# Patient Record
Sex: Male | Born: 2000 | Hispanic: Yes | Marital: Single | Smoking: Never smoker
Health system: Southern US, Community
[De-identification: ages and names within clinical notes are randomized; demographics above are authoritative.]

## PROBLEM LIST (undated history)

## (undated) ENCOUNTER — Emergency Department (HOSPITAL_COMMUNITY): Admission: EM | Payer: Medicaid Other | Source: Home / Self Care

## (undated) DIAGNOSIS — G801 Spastic diplegic cerebral palsy: Secondary | ICD-10-CM

## (undated) DIAGNOSIS — K5904 Chronic idiopathic constipation: Secondary | ICD-10-CM

## (undated) DIAGNOSIS — R6510 Systemic inflammatory response syndrome (SIRS) of non-infectious origin without acute organ dysfunction: Secondary | ICD-10-CM

## (undated) DIAGNOSIS — Q112 Microphthalmos: Secondary | ICD-10-CM

## (undated) DIAGNOSIS — Z638 Other specified problems related to primary support group: Secondary | ICD-10-CM

## (undated) DIAGNOSIS — G8250 Quadriplegia, unspecified: Secondary | ICD-10-CM

## (undated) DIAGNOSIS — F79 Unspecified intellectual disabilities: Secondary | ICD-10-CM

## (undated) DIAGNOSIS — Q159 Congenital malformation of eye, unspecified: Secondary | ICD-10-CM

## (undated) DIAGNOSIS — J209 Acute bronchitis, unspecified: Secondary | ICD-10-CM

## (undated) DIAGNOSIS — F19939 Other psychoactive substance use, unspecified with withdrawal, unspecified: Secondary | ICD-10-CM

## (undated) DIAGNOSIS — K219 Gastro-esophageal reflux disease without esophagitis: Secondary | ICD-10-CM

## (undated) DIAGNOSIS — G809 Cerebral palsy, unspecified: Secondary | ICD-10-CM

## (undated) HISTORY — PX: BACK SURGERY: SHX140

## (undated) HISTORY — DX: Acute bronchitis, unspecified: J20.9

## (undated) HISTORY — DX: Systemic inflammatory response syndrome (sirs) of non-infectious origin without acute organ dysfunction: R65.10

## (undated) HISTORY — DX: Spastic diplegic cerebral palsy: G80.1

## (undated) HISTORY — PX: GASTROSTOMY TUBE PLACEMENT: SHX655

## (undated) HISTORY — DX: Other psychoactive substance use, unspecified with withdrawal, unspecified: F19.939

## (undated) HISTORY — DX: Other specified problems related to primary support group: Z63.8

---
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
## 2019-01-04 ENCOUNTER — Telehealth: Payer: Self-pay

## 2019-01-04 NOTE — Telephone Encounter (Signed)
Fever, complaining about stomach hurting.  Not sleeping in two days. Made an appt.

## 2019-01-05 ENCOUNTER — Ambulatory Visit (INDEPENDENT_AMBULATORY_CARE_PROVIDER_SITE_OTHER): Payer: Medicaid Other | Admitting: Pediatrics

## 2019-01-05 ENCOUNTER — Telehealth: Payer: Self-pay | Admitting: Pediatrics

## 2019-01-05 ENCOUNTER — Encounter: Payer: Self-pay | Admitting: Pediatrics

## 2019-01-05 ENCOUNTER — Ambulatory Visit (HOSPITAL_COMMUNITY)
Admission: RE | Admit: 2019-01-05 | Discharge: 2019-01-05 | Disposition: A | Discharge status: Home / Self Care | Payer: Medicaid Other | Source: Ambulatory Visit | Attending: Pediatrics | Admitting: Pediatrics

## 2019-01-05 DIAGNOSIS — B349 Viral infection, unspecified: Secondary | ICD-10-CM | POA: Diagnosis not present

## 2019-01-05 DIAGNOSIS — R509 Fever, unspecified: Secondary | ICD-10-CM

## 2019-01-05 DIAGNOSIS — R109 Unspecified abdominal pain: Secondary | ICD-10-CM

## 2019-01-05 LAB — POCT INFLUENZA A/B
INFLUENZA A, POC: NEGATIVE
Influenza B, POC: NEGATIVE

## 2019-01-05 NOTE — Progress Notes (Signed)
Subjective:     History was provided by the father and step mother.  Jeffery [name] is a 18 y.o. male here for evaluation of fever and abdominal pain. Symptoms began a few days ago, with little improvement since that time. Associated symptoms include none. Father denies nasal congestion, nonproductive cough and no vomiting, diarrhea or changes in stools that father is aware of. The patient was sent home from school about 4 days ago, when his father states the school teacher said he behaves as if his "abdomen hurts." He last had Tylenol this morning for a "fever" and his father states that the patient seems to be less uncomfortable after he takes Tylenol. His father also states that the fever has been going on for the past 4 days as well . He is G tube fed and they state that they have not had any problems with his G tube feedings.  His step mother states he has had at least 4 wet diapers since last night.   The following portions of the patient's history were reviewed and updated as appropriate: allergies, current medications, past family history, past medical history, past social history, past surgical history and problem list.  Review of Systems Constitutional: negative except for fevers Eyes: negative for redness. Ears, nose, mouth, throat, and face: negative for nasal congestion Respiratory: negative for cough. Gastrointestinal: negative for diarrhea and vomiting.   Objective:    Temp 98 F (36.7 C)   Wt 52 lb (23.6 kg)  General:   alert  HEENT:   right and left TM normal without fluid or infection, neck without nodes and throat normal without erythema or exudate  Neck:  no adenopathy.  Lungs:  clear to auscultation bilaterally  Heart:  regular rate and rhythm, S1, S2 normal, no murmur, click, rub or gallop  Abdomen:   normal findings: bowel sounds normal, no masses palpable and soft, non-tender     Assessment:    Fever Abdominal Pain.   Plan:  .1. Abdominal pain,  unspecified abdominal location - POCT Influenza A/B negative - DG Abd 2 Views; Future - Discussed xray result with father, recommended increasing Miralax to 3 to 4 times a day until stools are liquid consistency, then go back to twice a day   2. Fever, unspecified fever cause - POCT Influenza A/B negative   Normal progression of disease discussed. All questions answered. Follow up as needed should symptoms fail to improve.    RTC as scheduled

## 2019-01-05 NOTE — Telephone Encounter (Signed)
Discussed xray result with father, recommended increasing Miralax to 3 to 4 times a day until stools are liquid consistency, then go back to twice a day

## 2019-01-09 ENCOUNTER — Telehealth: Payer: Self-pay

## 2019-01-09 NOTE — Telephone Encounter (Signed)
Kim RN wanted an approval for the visit to give supplies to the home. Kim talked to Dr. [name].Break downs on the skin on the hip wanted mom to make and appt. To be seen.

## 2019-01-10 ENCOUNTER — Telehealth: Payer: Self-pay | Admitting: Licensed Clinical Social Worker

## 2019-01-10 ENCOUNTER — Encounter: Payer: Self-pay | Admitting: Pediatrics

## 2019-01-10 ENCOUNTER — Ambulatory Visit: Payer: Medicaid Other | Admitting: Pediatrics

## 2019-01-10 ENCOUNTER — Ambulatory Visit (INDEPENDENT_AMBULATORY_CARE_PROVIDER_SITE_OTHER): Payer: Medicaid Other | Admitting: Pediatrics

## 2019-01-10 ENCOUNTER — Encounter: Payer: Medicaid Other | Admitting: Licensed Clinical Social Worker

## 2019-01-10 VITALS — Temp 100.0°F

## 2019-01-10 DIAGNOSIS — J40 Bronchitis, not specified as acute or chronic: Secondary | ICD-10-CM | POA: Diagnosis not present

## 2019-01-10 DIAGNOSIS — L089 Local infection of the skin and subcutaneous tissue, unspecified: Secondary | ICD-10-CM | POA: Diagnosis not present

## 2019-01-10 LAB — POC INFLUENZA A&B (BINAX/QUICKVUE)
INFLUENZA B, POC: NEGATIVE
Influenza A, POC: NEGATIVE

## 2019-01-10 MED ORDER — MUPIROCIN 2 % EX OINT: TOPICAL_OINTMENT | CUTANEOUS | 1 refills | Status: DC

## 2019-01-10 MED ORDER — AZITHROMYCIN 200 MG/5ML PO SUSR: ORAL | 0 refills | Status: DC

## 2019-01-10 NOTE — Patient Instructions (Signed)
Bronquitis aguda en nios  Acute Bronchitis, Pediatric    La bronquitis aguda es la hinchazn repentina (aguda) de las vas areas (bronquios) en los pulmones. La bronquitis aguda hace que se llenen estas vas con mucosidad y provoca dificultad para respirar. Tambin puede causar tos o sibilancias.  En los nios, la bronquitis aguda puede durar varias semanas. Una tos causada por bronquitis puede durar incluso ms tiempo. La bronquitis puede causar ms problemas pulmonares, como la enfermedad pulmonar obstructiva crnica (EPOC).  Cules son las causas?  Esta afeccin puede ser causada por grmenes y por sustancias que irritan los pulmones, por ejemplo:   Virus del resfro y de la gripe. La causa ms frecuente de esta afeccin en los nios menores de 1 ao de edad es el virus respiratorio sincicial (VRS).   Bacterias.   Exposicin al humo del tabaco, polvo, gases y contaminacin del aire.  Qu incrementa el riesgo?  Es ms probable que esta afeccin se manifieste en nios que:   Tienen contacto cercano con alguien con bronquitis aguda.   Estn expuestos a sustancias que irritan los pulmones, como el humo del tabaco, polvo, gases y vapores.   Tienen un sistema inmunitario dbil.   Tienen una afeccin respiratoria, como el asma.  Cules son los signos o sntomas?  Los sntomas de esta afeccin incluyen los siguientes:   Tos.   Despedir una mucosidad transparente, amarilla o verde al toser.   Sibilancias.   Opresin o congestin en el pecho.   Falta de aire.   Fiebre.   Dolores en el cuerpo.   Escalofros.   Dolor de garganta.  Cmo se diagnostica?  Esta afeccin se diagnostica mediante un examen fsico. Durante el examen, el pediatra escuchar los pulmones del nio. El mdico tambin podr hacer lo siguiente:   Analizar una muestra de mucosidad del nio para detectar una infeccin bacteriana.   Confirmar el nivel de oxgeno en la sangre del nio. Esto se hace para determinar si hay  neumona.   Realizar una radiografa de trax o pruebas de la funcin pulmonar para descartar una neumona u otras afecciones.   Realizar anlisis de sangre.  El mdico tambin har preguntas sobre los sntomas y antecedentes mdicos del nio.  Cmo se trata?  La mayora de los casos de bronquitis aguda se recupera con el tiempo, sin tratamiento. El pediatra tambin puede recomendar lo siguiente:   Beber ms lquidos. Beber ms cantidad de lquidos ayuda al nio a diluir la mucosidad, lo cual puede facilitar la respiracin.   Tomar un medicamento para la tos.   Tomar un antibitico. Si la afeccin del nio se debe a una bacteria, se le puede recetar un antibitico.   Usar un inhalador para respirar mejor y controlar la tos.   Usar un humidificador o vapor para aflojar la mucosidad y mejorar la respiracin.  Siga estas indicaciones en su casa:  Medicamentos   Administre al nio los medicamentos de venta libre y los recetados solamente como se lo haya indicado el pediatra.   Si le recetaron un antibitico al nio, adminstreselo segn lo indicado por el pediatra. Nointerrumpa el antibitico aunque el nio comience a sentirse mejor.   No le administre miel ni productos para la tos que contengan miel a los nios menores de 1 ao de edad por el riesgo del botulismo. La miel puede ayudar a disminuir la tos en los nios mayores de 1 ao de edad.   No le administre medicamentos antitusivos al nio a   menos que el pediatra se lo indique. En la mayora de los casos, los medicamentos para la tos no se deben administrar a nios menores de 6 aos de edad.  Instrucciones generales     Permita que el nio descanse.   Haga que el nio beba suficiente lquido para mantener la orina de color amarillo plido.   Evite la exposicin del nio al humo de tabaco u otras sustancias perjudiciales, tales como polvo o vapores.   Utilice un inhalador, un humidificador o vapor, segn lo indicado por el mdico. Para usar el vapor  de manera segura:  ? Hierva agua.  ? Pase el agua a un bol.  ? Haga que el nio inhale el vapor del bol.   Concurra a todas las visitas de control como se lo haya indicado el pediatra. Esto es importante.  Cmo se evita?  Para disminuir el riesgo de que el nio vuelva a sufrir esta afeccin:   Asegrese de que el nio se lave las manos con agua y jabn con frecuencia. Haga que el nio use un desinfectante para manos si no se dispone de agua y jabn.   Mantenga al da todas las vacunas del nio.   Asegrese de que el nio reciba la vacuna contra la gripe todos los aos.   Ayude al nio a evitar la exposicin al humo exhalado por otros fumadores y otros irritantes pulmonares.  Comunquese con un mdico si:   La tos o la sibilancia del nio duran 2 semanas o ms.   La tos o la sibilancia del nio empeoran despus de que el nio se recuesta o est activo.  Solicite ayuda de inmediato si:   El nio escupe sangre al toser.   El nio est muy dbil, cansado o le falta el aire.   El nio se desmaya.   El nio vomita.   El nio tiene dolor de cabeza intenso.   El nio tiene fiebre alta que no disminuye.   El nio es menor de 3meses y tiene fiebre de 100F (38C) o ms.  Esta informacin no tiene como fin reemplazar el consejo del mdico. Asegrese de hacerle al mdico cualquier pregunta que tenga.  Document Released: 10/29/2016 Document Revised: 08/26/2017 Document Reviewed: 04/28/2016  Elsevier Interactive Patient Education  2019 Elsevier Inc.

## 2019-01-10 NOTE — Progress Notes (Signed)
Subjective:     History was provided by the mother..Due to language barrier, an interpreter was present during the history-taking and subsequent discussion (and for part of the physical exam) with this patient.  Jeffery [name] is a 18 y.o. male here for evaluation of cough and fever. Symptoms began a few days ago, with no improvement since that time. Associated symptoms include nasal congestion. Patient denies vomiting, diarrhea .  He also has an area of skin break down on his right hip. His mother has used a "cream" and it has helped.   The following portions of the patient's history were reviewed and updated as appropriate: allergies, current medications, past medical history, past social history and problem list.  Review of Systems Constitutional: negative for fevers Eyes: negative for redness. Ears, nose, mouth, throat, and face: negative for nasal congestion Respiratory: negative except for cough. Gastrointestinal: negative for diarrhea and vomiting.   Objective:    Temp 100 F (37.8 C)  General:   alert  HEENT:   right and left TM normal without fluid or infection, neck without nodes, throat normal without erythema or exudate and nasal mucosa congested  Neck:  no adenopathy.  Lungs:  clear to auscultation bilaterally  Heart:  regular rate and rhythm, S1, S2 normal, no murmur, click, rub or gallop  Abdomen:   soft, non-tender; bowel sounds normal; no masses,  no organomegaly  Skin:   small area erythema of right hip      Assessment:    Bronchitis  Skin infection.   Plan:  .1. Bronchitis - POC Influenza A&B(BINAX/QUICKVUE) negative  - azithromycin (ZITHROMAX) 200 MG/5ML suspension; Take 6 ml on day one, then 3 ml once a day for 4 more days. Spanish instructions please.  Dispense: 15 mL; Refill: 0  2. Skin infection - mupirocin ointment (BACTROBAN) 2 %; Apply to rash on buttocks three times a day for 7 days. Spanish instructions please  Dispense: 22 g; Refill:  1 After skin improves, use Vaseline two to three times a day to protect skin    Normal progression of disease discussed. All questions answered. Follow up as needed should symptoms fail to improve.

## 2019-01-26 ENCOUNTER — Encounter: Payer: Self-pay | Admitting: Pediatrics

## 2019-02-03 ENCOUNTER — Telehealth: Payer: Self-pay | Admitting: Pediatrics

## 2019-02-03 NOTE — Telephone Encounter (Signed)
Tc from [facility], works with his M[facility] inquiring on some information about patient, non urgent can be contacted on Monday.

## 2019-02-06 NOTE — Telephone Encounter (Signed)
Called to see what information she was needing from pt. No answer. Left message to give [place] a call.

## 2019-02-07 NOTE — Telephone Encounter (Signed)
Jeffery [name] is wanting to know if pt can qualify for a program within medicaid either personal or CAT program to help mom with booster seat/ car seat. Also wants to know if step mom can be the CAP aide since they have tried having someone at home to give care to pt and pt did not like that and would not help as they thought the aide would. If so she is needing the provider to make that referral.

## 2019-02-07 NOTE — Telephone Encounter (Signed)
Please call and find out from [place] if they can fax forms to [place]. I have had patient like Jeffery [name] receive those things, but, it has always been after the agency, etc sends me the order form for what is needed.   Thank you

## 2019-02-07 NOTE — Telephone Encounter (Signed)
Jeffery [name] states she will speak to step mom to see which program she wants to do and then will contact us back

## 2019-02-08 ENCOUNTER — Telehealth: Payer: Self-pay

## 2019-02-08 NOTE — Telephone Encounter (Signed)
Already done the referral today

## 2019-02-08 NOTE — Telephone Encounter (Signed)
Called about the referral to be sent to [facility] program. I told her I will get the referral done today and I will call her after it is done today.

## 2019-02-15 NOTE — Telephone Encounter (Signed)
Jeffery [name] has taken care of this.  

## 2019-02-17 ENCOUNTER — Encounter: Payer: Self-pay | Admitting: Pediatrics

## 2019-02-20 ENCOUNTER — Encounter: Payer: Self-pay | Admitting: Pediatrics

## 2019-02-20 ENCOUNTER — Emergency Department (HOSPITAL_COMMUNITY)
Admission: EM | Admit: 2019-02-20 | Discharge: 2019-02-20 | Disposition: A | Discharge status: Home / Self Care | Payer: Medicaid Other | Attending: Emergency Medicine | Admitting: Emergency Medicine

## 2019-02-20 ENCOUNTER — Ambulatory Visit (INDEPENDENT_AMBULATORY_CARE_PROVIDER_SITE_OTHER): Payer: Medicaid Other | Admitting: Pediatrics

## 2019-02-20 ENCOUNTER — Other Ambulatory Visit: Payer: Self-pay

## 2019-02-20 ENCOUNTER — Encounter (HOSPITAL_COMMUNITY): Payer: Self-pay | Admitting: Emergency Medicine

## 2019-02-20 VITALS — Wt <= 1120 oz

## 2019-02-20 DIAGNOSIS — G800 Spastic quadriplegic cerebral palsy: Secondary | ICD-10-CM | POA: Diagnosis not present

## 2019-02-20 DIAGNOSIS — Z79899 Other long term (current) drug therapy: Secondary | ICD-10-CM | POA: Insufficient documentation

## 2019-02-20 DIAGNOSIS — Z931 Gastrostomy status: Secondary | ICD-10-CM | POA: Insufficient documentation

## 2019-02-20 DIAGNOSIS — L89104 Pressure ulcer of unspecified part of back, stage 4: Secondary | ICD-10-CM | POA: Diagnosis not present

## 2019-02-20 DIAGNOSIS — F79 Unspecified intellectual disabilities: Secondary | ICD-10-CM | POA: Insufficient documentation

## 2019-02-20 DIAGNOSIS — Z4800 Encounter for change or removal of nonsurgical wound dressing: Secondary | ICD-10-CM | POA: Diagnosis present

## 2019-02-20 LAB — CBC WITH DIFFERENTIAL/PLATELET
Abs Immature Granulocytes: 0.01 10*3/uL (ref 0.00–0.07)
Basophils Absolute: 0.1 10*3/uL (ref 0.0–0.1)
Basophils Relative: 1 %
Eosinophils Absolute: 0.1 10*3/uL (ref 0.0–0.5)
Eosinophils Relative: 2 %
HCT: 41.4 % (ref 39.0–52.0)
Hemoglobin: 13.1 g/dL (ref 13.0–17.0)
Immature Granulocytes: 0 %
Lymphocytes Relative: 48 %
Lymphs Abs: 2.9 10*3/uL (ref 0.7–4.0)
MCH: 30.5 pg (ref 26.0–34.0)
MCHC: 31.6 g/dL (ref 30.0–36.0)
MCV: 96.5 fL (ref 80.0–100.0)
Monocytes Absolute: 0.3 10*3/uL (ref 0.1–1.0)
Monocytes Relative: 6 %
Neutro Abs: 2.6 10*3/uL (ref 1.7–7.7)
Neutrophils Relative %: 43 %
PLATELETS: 331 10*3/uL (ref 150–400)
RBC: 4.29 MIL/uL (ref 4.22–5.81)
RDW: 14.8 % (ref 11.5–15.5)
WBC: 6 10*3/uL (ref 4.0–10.5)
nRBC: 0 % (ref 0.0–0.2)

## 2019-02-20 LAB — BASIC METABOLIC PANEL
Anion gap: 7 (ref 5–15)
BUN: 7 mg/dL (ref 6–20)
CO2: 28 mmol/L (ref 22–32)
Calcium: 9.5 mg/dL (ref 8.9–10.3)
Chloride: 106 mmol/L (ref 98–111)
Creatinine, Ser: 0.36 mg/dL — ABNORMAL LOW (ref 0.61–1.24)
GFR calc Af Amer: >60 mL/min (ref >60)
GFR calc non Af Amer: >60 mL/min (ref >60)
Glucose, Bld: 87 mg/dL (ref 70–99)
Potassium: 3.9 mmol/L (ref 3.5–5.1)
Sodium: 141 mmol/L (ref 135–145)

## 2019-02-20 MED ORDER — COLLAGENASE 250 UNIT/GM EX OINT
TOPICAL_OINTMENT | Freq: Every day | CUTANEOUS | Status: DC
  Administered 2019-02-20: 1 via TOPICAL
  Filled 2019-02-20: qty 30

## 2019-02-20 NOTE — ED Triage Notes (Signed)
Pt mother reports they were referred by pediatrician for sore on his left butt cheek, reports it is in the process of healing.

## 2019-02-20 NOTE — Discharge Instructions (Addendum)
You should have received a phone call confirming your appointment with the wound care clinic tomorrow.  This is very important! Please make sure that you go to this appointment.   Let them know that we sent you home with the Santyl ointment.   Apply ointment daily.   Return to ER for fevers, new or worsening symptoms, any additional concerns.

## 2019-02-20 NOTE — Consult Note (Signed)
WOC Nurse wound consult note Patient in [facility] ED014.  Mother at bedside. 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 Cover with a saline moistened gauze, then dry gauze or ABD pad.  Change daily. Additional recommendations: keep the wound care center appointment scheduled for 3/31.   I have discussed with Elizabeth Sauer, PA, the need for a chair cushion and have recommended a Roho cushion that will hopefully to obtained by care at the wound care center.   Also, the mother has been instructed via interpreter services, to turn her son and keep him off of the area while sitting and while lying down.  She was able to correctly state the steps of applying the Santyl and dressings, again, with the use of interpreter services.  Thank you for the consult.  Discussed plan of care with the patient and PA.  WOC nurse will not follow at this time.  Please re-consult the WOC team if needed.  Helmut Muster, RN, MSN, CWOCN, CNS-BC, pager [phone]

## 2019-02-20 NOTE — ED Notes (Signed)
Discharge instructions discussed with guardian. Guardian verbalized understanding. Pt stable and leaving with guardian.

## 2019-02-20 NOTE — ED Notes (Signed)
Santyl applied to wound, covered it with moist gauze and an abd pad.  Instructed mother on wound care.
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
## 2019-02-20 NOTE — Progress Notes (Signed)
  Subjective:     Patient ID: Jeffery [name], male   DOB: [birth year], 18 y.o.   MRN: [id number]  HPI  .Due to language barrier, an interpreter was present during the history-taking and subsequent discussion (and for part of the physical exam) with this patient.  The patient is here today with his step mother for concern about a wound on his lower back. She feels that is has improved since he was last seen here in February. No fevers. She has been using different OTC "first aid" creams on the area. He does have cerebral palsy, no home health care.   Review of Systems .Review of Symptoms: General ROS: negative for - fever ENT ROS: negative for - nasal congestion Respiratory ROS: no cough, shortness of breath, or wheezing Gastrointestinal ROS: negative for - diarrhea or nausea/vomiting     Objective:   Physical Exam Wt 52 lb 4 oz (23.7 kg)   General Appearance:  Alert, cooperative, no distress, appropriate for age                            Head:  Normocephalic, no obvious abnormality            Skin/Hair/Nails:  Skin warm, dry, approx 4 cm by 4 cm ulcer 4                   Neurologic:  Alert and oriented, wheel chair      Assessment:     Decubitus Ulcer     Plan:     .1. Pressure injury of back, stage 4 (HCC) Spoke with provider at [facility] ED - MD was told that the patient could be "ruled out for sepsis" and that "there was not anyone there who could manage his wound, and to call [facility] outpatient wound care clinic" MD had patient's step mother take him to the [facility] ED in [place] now, step mother agreed to this plan

## 2019-02-21 ENCOUNTER — Encounter (HOSPITAL_BASED_OUTPATIENT_CLINIC_OR_DEPARTMENT_OTHER): Payer: Self-pay

## 2019-02-21 ENCOUNTER — Encounter (HOSPITAL_BASED_OUTPATIENT_CLINIC_OR_DEPARTMENT_OTHER): Payer: Medicaid Other | Attending: Internal Medicine

## 2019-02-21 DIAGNOSIS — L89324 Pressure ulcer of left buttock, stage 4: Secondary | ICD-10-CM | POA: Insufficient documentation

## 2019-02-21 DIAGNOSIS — G800 Spastic quadriplegic cerebral palsy: Secondary | ICD-10-CM | POA: Diagnosis not present

## 2019-02-21 DIAGNOSIS — Z931 Gastrostomy status: Secondary | ICD-10-CM | POA: Insufficient documentation

## 2019-02-21 DIAGNOSIS — F73 Profound intellectual disabilities: Secondary | ICD-10-CM | POA: Insufficient documentation

## 2019-03-02 ENCOUNTER — Telehealth: Payer: Self-pay | Admitting: Pediatrics

## 2019-03-02 NOTE — Telephone Encounter (Signed)
MD received a letter from Evie Lacks with [employer] [place], Nurse Care Manager regarding patient (see scanned letter). MD called [facility] and spoke with a representative who stated that they changed name of their company, and the steps to send a referral. Since that was not helpful, MD called and spoke with RN Eunice Blase and she stated that that a nurse, Christiane Ha with the [facility] in [place] had talked with her, and he said he/his clinic would take care of orders for home health nurse for wound care and order a condom catheter (to keep urine from contaminating wound).

## 2019-03-07 ENCOUNTER — Encounter (HOSPITAL_BASED_OUTPATIENT_CLINIC_OR_DEPARTMENT_OTHER): Payer: Medicaid Other | Attending: Internal Medicine

## 2019-03-07 DIAGNOSIS — G800 Spastic quadriplegic cerebral palsy: Secondary | ICD-10-CM | POA: Insufficient documentation

## 2019-03-07 DIAGNOSIS — F73 Profound intellectual disabilities: Secondary | ICD-10-CM | POA: Diagnosis not present

## 2019-03-07 DIAGNOSIS — Z931 Gastrostomy status: Secondary | ICD-10-CM | POA: Diagnosis not present

## 2019-03-07 DIAGNOSIS — L89324 Pressure ulcer of left buttock, stage 4: Secondary | ICD-10-CM | POA: Insufficient documentation

## 2019-03-07 DIAGNOSIS — L97522 Non-pressure chronic ulcer of other part of left foot with fat layer exposed: Secondary | ICD-10-CM | POA: Diagnosis not present

## 2019-03-21 DIAGNOSIS — L89324 Pressure ulcer of left buttock, stage 4: Secondary | ICD-10-CM | POA: Diagnosis not present

## 2019-03-27 ENCOUNTER — Encounter: Payer: Self-pay | Admitting: Licensed Clinical Social Worker

## 2019-04-04 ENCOUNTER — Encounter (HOSPITAL_BASED_OUTPATIENT_CLINIC_OR_DEPARTMENT_OTHER): Payer: Medicaid Other | Attending: Internal Medicine

## 2019-04-04 DIAGNOSIS — G800 Spastic quadriplegic cerebral palsy: Secondary | ICD-10-CM | POA: Insufficient documentation

## 2019-04-04 DIAGNOSIS — L89324 Pressure ulcer of left buttock, stage 4: Secondary | ICD-10-CM | POA: Insufficient documentation

## 2019-04-04 DIAGNOSIS — F73 Profound intellectual disabilities: Secondary | ICD-10-CM | POA: Insufficient documentation

## 2019-04-04 DIAGNOSIS — L97522 Non-pressure chronic ulcer of other part of left foot with fat layer exposed: Secondary | ICD-10-CM | POA: Insufficient documentation

## 2019-04-07 DIAGNOSIS — L89324 Pressure ulcer of left buttock, stage 4: Secondary | ICD-10-CM | POA: Diagnosis present

## 2019-04-07 DIAGNOSIS — L97522 Non-pressure chronic ulcer of other part of left foot with fat layer exposed: Secondary | ICD-10-CM | POA: Diagnosis not present

## 2019-04-07 DIAGNOSIS — G800 Spastic quadriplegic cerebral palsy: Secondary | ICD-10-CM | POA: Diagnosis not present

## 2019-04-07 DIAGNOSIS — F73 Profound intellectual disabilities: Secondary | ICD-10-CM | POA: Diagnosis not present

## 2019-04-21 DIAGNOSIS — L89324 Pressure ulcer of left buttock, stage 4: Secondary | ICD-10-CM | POA: Diagnosis not present

## 2019-04-24 NOTE — Telephone Encounter (Signed)
Called to follow up on forms needed.

## 2019-04-28 ENCOUNTER — Encounter (HOSPITAL_BASED_OUTPATIENT_CLINIC_OR_DEPARTMENT_OTHER): Payer: Medicaid Other | Attending: Internal Medicine

## 2019-04-28 DIAGNOSIS — Z09 Encounter for follow-up examination after completed treatment for conditions other than malignant neoplasm: Secondary | ICD-10-CM | POA: Insufficient documentation

## 2019-04-28 DIAGNOSIS — G800 Spastic quadriplegic cerebral palsy: Secondary | ICD-10-CM | POA: Insufficient documentation

## 2019-04-28 DIAGNOSIS — Z872 Personal history of diseases of the skin and subcutaneous tissue: Secondary | ICD-10-CM | POA: Insufficient documentation

## 2019-04-28 DIAGNOSIS — F73 Profound intellectual disabilities: Secondary | ICD-10-CM | POA: Insufficient documentation

## 2019-05-05 ENCOUNTER — Telehealth: Payer: Self-pay | Admitting: Pediatrics

## 2019-05-05 NOTE — Telephone Encounter (Signed)
No he does not have a feeding tube. Thank you for calling her back to let her know

## 2019-05-05 NOTE — Telephone Encounter (Signed)
Baker Janus from [employer] in calling in regards to pt and trying to find out if he gets Nutritions from feeding tube, if so what percentage [phone]

## 2019-05-05 NOTE — Telephone Encounter (Signed)
Called Gail to let her know per Dr. [name] pt doesn't have a feeding tube

## 2019-05-08 DIAGNOSIS — Z09 Encounter for follow-up examination after completed treatment for conditions other than malignant neoplasm: Secondary | ICD-10-CM | POA: Diagnosis present

## 2019-05-08 DIAGNOSIS — Z872 Personal history of diseases of the skin and subcutaneous tissue: Secondary | ICD-10-CM | POA: Diagnosis not present

## 2019-05-08 DIAGNOSIS — F73 Profound intellectual disabilities: Secondary | ICD-10-CM | POA: Diagnosis not present

## 2019-05-08 DIAGNOSIS — G800 Spastic quadriplegic cerebral palsy: Secondary | ICD-10-CM | POA: Diagnosis not present

## 2019-05-10 ENCOUNTER — Telehealth: Payer: Self-pay | Admitting: Pediatrics

## 2019-05-10 NOTE — Telephone Encounter (Signed)
MD spoke with RN Charna Busman and gave a verbal order for diaper program and to continue routine assessments of the patient once every 2 months.

## 2019-06-27 ENCOUNTER — Other Ambulatory Visit: Payer: Self-pay

## 2019-06-27 ENCOUNTER — Ambulatory Visit (INDEPENDENT_AMBULATORY_CARE_PROVIDER_SITE_OTHER): Payer: Medicaid Other | Admitting: Pediatrics

## 2019-06-27 ENCOUNTER — Ambulatory Visit (INDEPENDENT_AMBULATORY_CARE_PROVIDER_SITE_OTHER): Payer: Self-pay | Admitting: Licensed Clinical Social Worker

## 2019-06-27 ENCOUNTER — Encounter: Payer: Self-pay | Admitting: Pediatrics

## 2019-06-27 VITALS — BP 110/74 | Ht <= 58 in | Wt <= 1120 oz

## 2019-06-27 DIAGNOSIS — J4520 Mild intermittent asthma, uncomplicated: Secondary | ICD-10-CM

## 2019-06-27 DIAGNOSIS — Z0001 Encounter for general adult medical examination with abnormal findings: Secondary | ICD-10-CM

## 2019-06-27 DIAGNOSIS — H579 Unspecified disorder of eye and adnexa: Secondary | ICD-10-CM | POA: Diagnosis not present

## 2019-06-27 DIAGNOSIS — J301 Allergic rhinitis due to pollen: Secondary | ICD-10-CM | POA: Diagnosis not present

## 2019-06-27 DIAGNOSIS — K5904 Chronic idiopathic constipation: Secondary | ICD-10-CM | POA: Diagnosis not present

## 2019-06-27 DIAGNOSIS — G801 Spastic diplegic cerebral palsy: Secondary | ICD-10-CM

## 2019-06-27 MED ORDER — CETIRIZINE HCL 5 MG/5ML PO SOLN: ORAL | 5 refills | Status: DC

## 2019-06-27 MED ORDER — POLYETHYLENE GLYCOL 3350 17 GM/SCOOP PO POWD: ORAL | 2 refills | Status: DC

## 2019-06-27 MED ORDER — ALBUTEROL SULFATE HFA 108 (90 BASE) MCG/ACT IN AERS: INHALATION_SPRAY | RESPIRATORY_TRACT | 2 refills | Status: DC

## 2019-06-27 NOTE — Patient Instructions (Signed)
Asma en los adultos Asthma, Adult  El asma es una enfermedad prolongada (crnica) que causa episodios recurrentes de estrechamiento de las vas respiratorias. Las vas respiratorias son los conductos que van desde la Lawyer y la boca hasta los pulmones. Los episodios de asma, tambin denominados ataques de asma, pueden provocar tos, sibilancias, falta de aire y Tourist information centre manager. Las vas respiratorias se pueden llenar de mucosidad. Durante el ataque, puede ser difcil respirar. Los ataques de asma pueden ser leves o potencialmente mortales. El asma no puede curarse, pero los [employer] y los cambios en el estilo de vida lo ayudarn a Aeronautical engineer enfermedad y a Editor, commissioning ataques agudos. Cules son las causas? Se cree que la causa de esta afeccin son factores hereditarios (genticos) y la exposicin a factores ambientales; sin embargo, su causa exacta se desconoce. Hay muchas cosas que pueden provocar un ataque de asma o intensificar los sntomas de la enfermedad (factores desencadenantes). Los desencadenantes del asma son diferentes para cada persona. Los factores desencadenantes comunes incluyen los siguientes:  Moho.  Polvo.  Humo del cigarrillo.  Cucarachas.  Cosas que pueden causar sntomas de Buyer, retail (alrgenos), como el polen del pasto o los rboles y la caspa de los [place].  Sustancias contaminantes del aire, como limpiadores domsticos, humo de lea, niebla txica u olores qumicos.  El [place] fro, los cambios de Scientist, forensic y el viento (que aumenta la cantidad de moho y polen en el aire).  Emociones intensas, como llorar o rer [employer].  Estrs.  Ciertos medicamentos (como la aspirina) o algunos frmacos (como los betabloqueantes).  Los sulfitos presentes en los alimentos y las bebidas. Los alimentos y las bebidas que pueden contener sulfitos son las frutas desecadas, las papas fritas y los vinos espumantes.  Enfermedades infecciosas o inflamatorias, como la gripe,  el resfro o la inflamacin de las membranas nasales (rinitis).  Enfermedad de reflujo gastroesofgico (ERGE).  Ejercicios o actividades extenuantes. Cules son los signos o los sntomas? Los sntomas de esta afeccin pueden aparecer inmediatamente despus de que se desencadene el asma, o varias horas ms tarde. Algunos de los sntomas son los siguientes:  Sibilancias. Sonido parecido a un chiflido al Ambulance person.  Tos excesiva durante la noche o temprano por la maana.  Tos frecuente o intensa durante un resfro comn.  Opresin en el pecho.  Falta de aire.  Cansancio (fatiga) al realizar actividades que requieren un mnimo esfuerzo. Cmo se diagnostica? Esta afeccin se diagnostica en funcin de lo siguiente:  Sus antecedentes mdicos.  [employer], entre ellos, los siguientes: ? Estudios de funcin pulmonar y estudios pulmonares (espirometra). Estos estudios pueden evaluar el flujo de aire en sus pulmones. ? Pruebas de alergia. ? Estudios de diagnstico por imgenes, como radiografas. Cmo se trata? No hay cura para esta afeccin, pero el tratamiento puede ayudar a [employer] sntomas. Por lo general, el tratamiento del asma incluye lo siguiente:  Identificar y Product/process development scientist los factores desencadenantes del asma.  Tomar medicamentos para controlar los sntomas. Generalmente, se usan dos tipos de medicamentos para tratar el asma: ? Medicamentos de control. Estos ayudan a Mining engineer aparicin de los sntomas de asma. Generalmente, se toman [employer]. ? Medicamentos de [place] o de rescate de accin rpida. Estos alivian rpidamente los sntomas del asma al expandir las vas respiratorias estrechas. Se utilizan cuando es necesario y proporcionan alivio a Control and instrumentation engineer.  Usar oxgeno complementario. Puede que esto sea necesario durante un episodio grave.  Usar otros medicamentos, como  los siguientes: ? Medicamentos para las [place], tales como antihistamnicos,  en caso de que sus ataques de asma sean causados por alrgenos. ? Medicamentos inmunolgicos (inmunomoduladores). Estos medicamentos controlan el sistema inmunitario.  Crear un plan de accin para el asma. Esto es una planificacin por escrito para el control y el tratamiento de los ataques de asma. Este plan incluye lo siguiente: ? [place] lista de los factores desencadenantes del asma y el modo de evitarlos. ? Informacin sobre Cabin crew en que se deben tomar los medicamentos y cundo hay que [employer] dosis. ? Indicaciones sobre el uso de un dispositivo llamado espirmetro. El espirmetro es un dispositivo que mide el funcionamiento de los pulmones y la gravedad de su asma. Lo ayuda a controlar la enfermedad. Siga estas instrucciones en su casa: Film/video editor de su casa Controle el ambiente de su casa de la siguiente manera para Product/process development scientist los desencadenantes y prevenir los ataques de asma:  Cambie regularmente el filtro de la calefaccin y del aire acondicionado.  Limite el uso de hogares o estufas a lea.  Elimine las plagas (como cucarachas, ratones) y sus excrementos.  Deseche las plantas si observa moho en ellas.  Limpie regularmente los pisos y el polvo. Utilice productos sin perfume.  Intente que otra persona pase la aspiradora con regularidad. Permanezca fuera de las habitaciones mientras las estn aspirando y durante algn tiempo despus. Si usted pasa la [place], use una mscara para polvo de las que se consiguen en la [place], una bolsa de aspiradora de doble capa o microfiltro o una aspiradora con un filtro de aire de alta eficiencia (HEPA).  Reemplace las alfombras por pisos de [place], baldosas o vinilo. Las alfombras pueden retener la caspa de los animales y [place].  Use almohadas, mantas y Government social research officer.  Mantenga su habitacin libre de desencadenantes.  Evite las [employer] y [employer] ventanas cerradas cuando el aire tenga alrgenos.  Lave  la ropa de cama todas las semanas con agua caliente y squela con aire caliente.  Use mantas de polister o algodn.  Limpie baos y cocinas con lavandina. Si fuera posible, pdale a alguien que vuelva a pintar las paredes de estas habitaciones con Ardelia Mems pintura resistente a los hongos. Mantngase alejado de las habitaciones que se estn limpiando y pintando.  Lvese las manos frecuentemente con agua y [place]. Use desinfectante para manos si no dispone de [place] y [place].  No permita que nadie fume en su casa. Instrucciones generales  Use los medicamentos de venta libre y los recetados solamente como se lo haya indicado el mdico. ? Hable con el mdico si tiene preguntas acerca de cmo o cundo tomar los medicamentos. ? Tome nota en caso de que requiera dosis ms frecuentes.  No consuma ningn producto que contenga nicotina o tabaco, como cigarrillos y Psychologist, sport and exercise. Si necesita ayuda para dejar de fumar, consulte al mdico. Tambin evite la exposicin al humo de otros fumadores.  Use un espirmetro como se lo haya indicado su mdico. Anote y lleve un registro de los [place].  Conozca y [employer] de accin para el asma a fin de minimizar o Scientist, water quality un ataque de asma sin necesidad de buscar atencin mdica.  Asegrese de estar al da con las vacunas anuales como se lo haya indicado el mdico. Esto puede incluir vacunas contra la gripe y la neumona.  Evite realizar actividades al aire libre cuando la cantidad de alrgenos es alta y la calidad del aire es baja.  Al realizar actividades de invierno al aire [place], utilice un pasamontaas que le cubra la nariz y la boca. [address] fros, ejerctese en interiores, de ser posible.  Entre en calor antes de ejercitarse y tmese su tiempo para un perodo de enfriamiento luego de la [place].  Concurra a todas las visitas de seguimiento como se lo haya indicado el mdico. Esto es importante. Dnde buscar ms informacin  Para  obtener ms informacin sobre el asma, acuda a los Centers for [employer] and Prevention (Centros para el control y la prevencin de enfermedades) en http://www.clark.net/.htm  Para informacin sobre la calidad del aire, acuda a [employer] en WeightRating.nl Comunquese con un mdico si:  Tiene sibilancias, le falta el aire o tiene tos aun cuando toma el medicamento para prevenir los ataques.  La mucosidad que expectora cuando tose (esputo) es ms espesa que lo habitual.  Su esputo cambia de un color claro o blanco a un color amarillo, verde, gris o sanguinolento.  Los [employer] causan efectos secundarios, como erupcin cutnea, picazn, hinchazn o dificultad para respirar.  Necesita tomar un medicamento de alivio ms de 2 a 3veces por semana.  Su flujo mximo se mantiene entre el 50% y el 79% de su mejor valor personal despus de seguir el plan de accin durante 1hora.  Tiene fiebre. Solicite ayuda inmediatamente si:  Est empeorando y no responde al tratamiento durante un ataque de asma.  Le falta el aire cuando descansa o cuando hace muy poca actividad fsica.  Tiene dificultad para comer, beber o hablar.  Siente dolor u opresin en el pecho.  Tiene latidos cardacos rpidos o palpitaciones.  Tiene los labios o las uas de un tono [place].  Siente que est por desvanecerse, est mareado o se desmaya.  Su flujo mximo es menor que el 50% del Pharmacist, hospital personal.  Se siente demasiado cansado para respirar con normalidad. Resumen  El asma es una enfermedad prolongada (crnica) que causa episodios recurrentes de estrechamiento de las vas respiratorias. Estos episodios pueden provocar tos, sibilancias, falta de aire y Tourist information centre manager.  El asma no puede curarse, pero los [employer]ne[employer] y los cambios en el estilo de vida lo ayudarn a Aeronautical engineer enfermedad y a Editor, commissioning ataques agudos.  Asegrese de comprender cmo evitar los desencadenantes y cmo y  [place].  Los ataques de asma pueden ser leves o potencialmente mortales. Consiga ayuda de inmediato si tiene un ataque de asma y no responde al tratamiento con los medicamentos de rescate habituales. Esta informacin no tiene Marine scientist el consejo del mdico. Asegrese de hacerle al mdico cualquier pregunta que tenga. Document Released: 11/09/2005 Document Revised: 02/20/2019 Document Reviewed: 02/20/2019 Elsevier Patient Education  [place].

## 2019-06-27 NOTE — Progress Notes (Signed)
Adolescent Well Care Visit Jeffery [name] is a 18 y.o. male who is here for well care.    PCP:  Fransisca Connors, MD   History was provided by the father and stepmother. .Due to language barrier, an interpreter was present during the history-taking and subsequent discussion (and for part of the physical exam) with this patient.  Current Issues: Current concerns include asthma - doing well, not having weekly symptoms.   Would like refills on allergy medicine and medicine for constipation. Not having any problems now with allergies or constipation.    Sleep:  Sleep: normal   Social Screening: Lives with:  Father, step mother  Parental relations:  good   Screenings: Patient has a dental home: yes  Physical Exam:  Vitals:   06/27/19 1422  BP: 110/74  Weight: 52 lb 9.6 oz (23.9 kg)  Height: 4\' 7"  (1.397 m)   BP 110/74   Ht 4\' 7"  (1.397 m)   Wt 52 lb 9.6 oz (23.9 kg)   BMI 12.23 kg/m  Body mass index: body mass index is 12.23 kg/m. Blood pressure percentiles are not available for patients who are 18 years or older.  No exam data present  General Appearance:   alert  HENT: Normocephalic, no obvious abnormality, conjunctiva clear; circular lesion of left eye   Mouth:   Yellow appearing teeth, patient would not completely open mouth   Neck:   Supple; thyroid: no enlargement, symmetric, no tenderness/mass/nodules  Chest Normal   Lungs:   Clear to auscultation bilaterally, normal work of breathing  Heart:   Regular rate and rhythm, S1 and S2 normal, no murmurs;   Abdomen:   Soft, non-tender, no mass, or organomegaly  Musculoskeletal:   Increase upper and lower extremity tone in a wheel chair               Skin/Hair/Nails:   Skin warm, dry and intact, no rashes, no bruises or petechiae     Assessment and Plan:   .1. 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 Eye lesion - Ambulatory referral to Ophthalmology  4. Functional constipation - polyethylene glycol powder (GLYCOLAX/MIRALAX) 17 GM/SCOOP powder; Take 17 grams in 8 ounces of juice or water once to twice a day for up to one week as needed for constipation. Spanish instructions  Dispense: 510 g; Refill: 2  5. Seasonal allergic rhinitis due to pollen - cetirizine HCl (CETIRIZINE HCL CHILDRENS ALRGY) 5 MG/5ML SOLN; 10 mg by per G Tube route nightly as needed for allergies. Spanish instructions  Dispense: 300 mL; Refill: 5  6. Mild intermittent asthma without complication - albuterol (PROAIR HFA) 108 (90 Base) MCG/ACT inhaler; INHALE TWO PUFFS EVERY FOUR HOURS AS NEEDED FOR WHEEZING or COUGHING. Spanish instructions.  Dispense: 8.5 g; Refill: 2   BMI is appropriate for age  Hearing screening result:not examined Vision screening result: not examined  Counseling provided for all of the vaccine components  Orders Placed This Encounter  Procedures  . Ambulatory referral to Neurology  . Ambulatory referral to Ophthalmology   Family to take patient to local health dept for Men B #2 because of age    Return if symptoms worsen or fail to improve.Fransisca Connors, MD

## 2019-06-27 NOTE — BH Specialist Note (Signed)
Integrated Behavioral Health Follow Up Visit  MRN: [id number] Name: Jeffery [name]  Number of [place] Clinician visits: 1/6 Session Start time: 2:20pm  Session End time: 2:30pm Total time: 10 mins  Type of Service: Integrated Behavioral Health- Family Interpretor:No.  SUBJECTIVE: Jeffery [name] is a 18 y.o. male accompanied by Father and Stepmom Patient was referred by Dr. [name] to discuss transition to an adult PCP. Patient reports the following symptoms/concerns: No concerns reported by family today. Duration of problem: n/a; Severity of problem: n/a  OBJECTIVE: Mood: NA and Affect: Appropriate Risk of harm to self or others: No plan to harm self or others  LIFE CONTEXT: Family and Social: Patient lives with Dad, Step-Mom and Step-Sibling. School/Work: n/a, Father is caregiver Self-Care: Patient now has CAP services and an in-home nurse to help manage care.  Life Changes: Father is now legal guardian (Patient is deemed incompetent)  GOALS ADDRESSED: Patient will: 1.  Reduce symptoms of: stress  2.  Increase knowledge and/or ability of: stress reduction  3.  Demonstrate ability to: Increase adequate support systems for patient/family  INTERVENTIONS: Interventions utilized:  Link to [employer] Standardized Assessments completed: Not Needed  ASSESSMENT: Patient currently experiencing no concerns,  Dad was provided a list of PCP's for adults in the area based on Patient's age and health needs.   Patient may benefit from follow up as needed until he can be connected with general PCP for adults.  PLAN: 1. Follow up with behavioral health clinician as needed 2. Behavioral recommendations: return as needed 3. Referral(s): [place] (In Clinic)   Georgianne Fick, [facility]

## 2019-08-22 ENCOUNTER — Telehealth: Payer: Self-pay | Admitting: Pediatrics

## 2019-08-22 DIAGNOSIS — G800 Spastic quadriplegic cerebral palsy: Secondary | ICD-10-CM

## 2019-08-22 NOTE — Telephone Encounter (Signed)
Please call parent and let her know that patient does have more refills of albuterol at their pharmacy. His parent needs to call the pharmacy for more refills.  Also, could you call [place] to find out if and how I can order a "shower chair and stroller for transportation" for this patient. I received a document with those requested for the patient from [place] for the patient.

## 2019-08-23 MED ORDER — UNABLE TO FIND: 0 refills | Status: AC

## 2019-08-23 NOTE — Telephone Encounter (Addendum)
Rx printed and ready for faxing to [place] for shower chair.   No rx written for stroller, since [place] only has wheelchairs.   Could you call patient's [place] to see where I could send on order for stroller for this patien, her name is Nurse Caryl Pina, [phone]  Thank you!

## 2019-08-23 NOTE — Telephone Encounter (Signed)
Called to let know of refills not able to leave message due to vm not being set up.

## 2019-08-23 NOTE — Addendum Note (Signed)
Addended by: Fransisca Connors on: 08/23/2019 10:45 AM   Modules accepted: Orders

## 2019-08-23 NOTE — Telephone Encounter (Signed)
Called and spoke to Jeffery [name] states they don't have a Chair for transportation only thing they have is a wheelchair. They do have a shower chair all they need is a Rx diagnosis and a ht and wt. Can be faxed to [phone]

## 2019-08-23 NOTE — Telephone Encounter (Signed)
See below.  Thank you

## 2019-08-23 NOTE — Telephone Encounter (Signed)
Called Caryl Pina no answer left message to give [place] a call. Let dad know that he can get refills for albuterol and rx was sent for a shower chair. Parents appreciative call.

## 2019-08-24 ENCOUNTER — Telehealth: Payer: Self-pay | Admitting: Licensed Clinical Social Worker

## 2019-08-24 DIAGNOSIS — G801 Spastic diplegic cerebral palsy: Secondary | ICD-10-CM

## 2019-08-24 NOTE — Telephone Encounter (Signed)
A user error has taken place: Ashley's number is [phone]

## 2019-08-24 NOTE — Telephone Encounter (Signed)
Corrected fax number [phone], she will fax over a new order as well.  

## 2019-08-24 NOTE — Telephone Encounter (Signed)
Jeffery [name] called back from [facility] to discuss barriers getting stroller requested by Step-Mom. I confirmed that the Patient does have CAP services, she asked where we are with referral to PT.  Asked that we follow up with referral to PT because they can order stroller if deemed necessary.

## 2019-08-30 NOTE — Telephone Encounter (Signed)
PT ordered, thank you!

## 2019-08-30 NOTE — Telephone Encounter (Signed)
Please take a look at this, can you do referral for PT and then they can order stroller

## 2019-08-30 NOTE — Addendum Note (Signed)
Addended by: Fransisca Connors on: 08/30/2019 11:50 AM   Modules accepted: Orders

## 2019-09-06 NOTE — Telephone Encounter (Signed)
A user error has taken place: no phone note needed

## 2019-09-12 ENCOUNTER — Telehealth: Payer: Self-pay

## 2019-09-12 NOTE — Telephone Encounter (Signed)
Caryl Pina called about meds. Dose on his gabatentin. [phone].

## 2019-09-12 NOTE — Telephone Encounter (Signed)
Home health nurse or other nurse needs to contact Neurology about Gabapentin because none of the providers here prescribe that medication.   Thank you

## 2019-09-12 NOTE — Telephone Encounter (Signed)
Called Jeffery [name] back to let her know and got the voicemail and I left her a message what to do.

## 2019-11-02 ENCOUNTER — Telehealth: Payer: Self-pay | Admitting: Pediatrics

## 2019-11-02 NOTE — Telephone Encounter (Signed)
With further research the last shipment was on 07-20-2019, upon review the address had changed. I spoke with Jeffery [name] and she states she will get information updated and he should receive the shipment within 3 days. Reached out to [place], unable to leave voicemail

## 2019-11-02 NOTE — Telephone Encounter (Signed)
I am not sure why he has not received diapers. I have signed many forms for him over the past few months.  I looked in Epic, unfortunately I can't print this (my printer is not working), I did sign diapers on 08/08/2019 for ONE YEAR (see Media).  Mother needs to contact the company herself to follow up on this problem.  Lavella Lemons gave family adult medicine providers and they need to make sure they are transferring him for adult care.   Dr. [name]

## 2019-11-02 NOTE — Telephone Encounter (Signed)
Pt parent stopped by and states they haven't received diapers since aug-2020(the Last visit here) and they state a form needs to be signed for this patient so he can continue to receive them, they are unsure of the location that provides the diapers

## 2019-11-08 ENCOUNTER — Telehealth: Payer: Self-pay

## 2019-11-08 NOTE — Telephone Encounter (Signed)
Pt received wrong size diapers and said that they are too big. Instructed pt to call the company that sent them the diapers to get correct size sent to them, and to call back if there was any issues. They were appreciative of information given and understood instruction.

## 2019-12-13 ENCOUNTER — Telehealth: Payer: Self-pay

## 2019-12-13 NOTE — Telephone Encounter (Signed)
TC stating that pt needs new prescription for diapers. Said that they need MD to send over a prescription to the company '[employer].' Instructed them that usually we are sent forms to fill out and fax back.Neither MD nor myself is familiar with this company and we don't have these forms in office. Instructed them that I would route the note to MD and give them a call back once I receive further instruction

## 2019-12-13 NOTE — Telephone Encounter (Signed)
Are either of you familiar with "[employer]"? I have not received any recent orders that I can recall on this patient.  Could you call this company for the family and find out our next steps (please see Megan's note).  Thank you   Dr. [name]

## 2019-12-13 NOTE — Telephone Encounter (Signed)
Megan/Donna this is a clinical task, please let me know if you need assistance.

## 2019-12-14 NOTE — Telephone Encounter (Signed)
I reached out to [employer] before I went out, they state they don't have pt in the records or as one of their patients.

## 2019-12-14 NOTE — Telephone Encounter (Signed)
MD completed [employer] form and gave to front clinic staff to fax

## 2019-12-14 NOTE — Telephone Encounter (Signed)
After speaking with MD, called pt to let them know that we will fill out [employer] form to have diapers sent to them. Confirmed with them that this change was okay and they said it was. They gave me the diaper size. LPN filled out [employer] form and placed it on MD's desk. Told family to call back with any other questions and that this would be taken care of as quickly as possible.

## 2019-12-14 NOTE — Telephone Encounter (Signed)
Called the company and they don't have this patient in their records. They stated that they do not fax forms out to offices, but typically providers fax over patient demographics, medical records, insurance, and 2 diagnosis codes, the items needed for the patient and the quantity needed. I left a voicemail with the incontinence specialist to find out more information.

## 2019-12-15 NOTE — Telephone Encounter (Signed)
Papers were faxed to [employer]-success recieved

## 2020-01-09 ENCOUNTER — Telehealth: Payer: Self-pay | Admitting: Licensed Clinical Social Worker

## 2020-01-09 NOTE — Telephone Encounter (Signed)
Received a call from Lindwood Qua with CAP through [facility] regarding referral made about one year ago for services.  She confirmed phone number we had on file and that there is still no one in the home that speaks English that we know of.

## 2020-01-15 ENCOUNTER — Other Ambulatory Visit: Payer: Self-pay

## 2020-01-15 DIAGNOSIS — G800 Spastic quadriplegic cerebral palsy: Secondary | ICD-10-CM

## 2020-01-15 NOTE — Telephone Encounter (Signed)
Called family to let them know.

## 2020-01-18 ENCOUNTER — Telehealth: Payer: Self-pay | Admitting: Licensed Clinical Social Worker

## 2020-01-18 NOTE — Telephone Encounter (Signed)
Clinician received a call from CAPS manager at [facility].  Casework Jeffery [name]) reports that she was told by Step-Mom that he will be going to a new doctor starting next week, caseworker asked if we had any information about what new Doctor he may be seeing.  Clinician noted that we have not received any record request indicating a transition in primary care but did see that the Patient has an upcoming appointment with Wound Care next week.  Clinician provided name of Wound Care office to caseworker to follow up.

## 2020-01-19 ENCOUNTER — Telehealth: Payer: Self-pay | Admitting: Pediatrics

## 2020-01-19 NOTE — Telephone Encounter (Signed)
Bethann Berkshire from CAPS called in regards to patient, wanting to speak with Erskine Squibb and then also trying to verify where patient received incontinence products from. Shela Nevin of [employer] she also wants a medication list faxed to [phone]

## 2020-01-19 NOTE — Telephone Encounter (Signed)
I don't have anything to do with medications, maybe we need to send that one to Dr. [name] to be sure everything is current.

## 2020-01-22 ENCOUNTER — Other Ambulatory Visit: Payer: Self-pay | Admitting: Pediatrics

## 2020-01-22 NOTE — Telephone Encounter (Signed)
Sorry, thank you for forwarding it to her, meant to add her when I first sent.

## 2020-01-22 NOTE — Telephone Encounter (Signed)
MD reviewed his medication list in Epic and I tried to print the list for faxing, but, I could not get to a medication screen in Epic that allowed me to do this. If you or anyone else knows a way to print medications, then please do for the CAPS person. Thank you!   Dr. [name]

## 2020-01-22 NOTE — Telephone Encounter (Signed)
Great, I will place a release and get them printed out, thank you.

## 2020-01-23 ENCOUNTER — Encounter (HOSPITAL_BASED_OUTPATIENT_CLINIC_OR_DEPARTMENT_OTHER): Payer: Medicaid Other | Admitting: Internal Medicine

## 2020-01-31 ENCOUNTER — Encounter (HOSPITAL_BASED_OUTPATIENT_CLINIC_OR_DEPARTMENT_OTHER): Payer: Medicaid Other | Attending: Internal Medicine | Admitting: Physician Assistant

## 2020-01-31 ENCOUNTER — Other Ambulatory Visit: Payer: Self-pay

## 2020-01-31 DIAGNOSIS — F73 Profound intellectual disabilities: Secondary | ICD-10-CM | POA: Insufficient documentation

## 2020-01-31 DIAGNOSIS — J45909 Unspecified asthma, uncomplicated: Secondary | ICD-10-CM | POA: Diagnosis not present

## 2020-01-31 DIAGNOSIS — H5462 Unqualified visual loss, left eye, normal vision right eye: Secondary | ICD-10-CM | POA: Diagnosis not present

## 2020-01-31 DIAGNOSIS — G800 Spastic quadriplegic cerebral palsy: Secondary | ICD-10-CM | POA: Diagnosis not present

## 2020-01-31 DIAGNOSIS — L89223 Pressure ulcer of left hip, stage 3: Secondary | ICD-10-CM | POA: Insufficient documentation

## 2020-01-31 DIAGNOSIS — L89329 Pressure ulcer of left buttock, unspecified stage: Secondary | ICD-10-CM | POA: Diagnosis present

## 2020-01-31 DIAGNOSIS — L89890 Pressure ulcer of other site, unstageable: Secondary | ICD-10-CM | POA: Diagnosis not present

## 2020-01-31 DIAGNOSIS — Z931 Gastrostomy status: Secondary | ICD-10-CM | POA: Diagnosis not present

## 2020-02-05 NOTE — Progress Notes (Signed)
Jeffery [name], Jeffery [name] ([id number]) Visit Report for 01/31/2020 Chief Complaint Document Details Date of Service: Jeffery [name], Jeffery [name] 01/31/2020 9:00 AM Patient Name: M. Patient Account Number: [id number] Medical Record Treating RN: Zenaida Deed [id number] Number: Other Clinician: Date of Birth/Sex: 10/21/2001 (19 y.o. M) Treating Lenda Kelp Primary Care Provider: Dereck Leep Provider/Extender: Referring Provider: FLEMING, CHARLENE Weeks in Treatment: 0 Information Obtained from: Patient Chief Complaint Left Ilium and Left Trochanter Ulcers Electronic Signature(s) Signed: 01/31/2020 10:04:01 AM By: Lenda Kelp PA-C Entered By: Lenda Kelp on 01/31/2020 10:04:01 Debridement Details Date of Service: -------------------------------------------------------------------------------- Jeffery [name] 01/31/2020 9:00 AM Patient Name: Jeffery Petit. Patient Account Number: [id number] Medical Record Treating RN: Zenaida Deed [id number] Number: Other Clinician: Date of Birth/Sex: 04/05/2001 (19 y.o. M) Treating Lenda Kelp Primary Care Provider: Dereck Leep Provider/Extender: Referring Provider: FLEMING, CHARLENE Weeks in Treatment: 0 Debridement Performed for Wound #3 Left Ilium Assessment: Performed By: Clinician Yevonne Pax, RN Debridement Type: Chemical/Enzymatic/Mechanical Agent Used: Santyl Level of Consciousness (Pre- Awake and Alert procedure): Pre-procedure Verification/Time Out Taken: Yes - 10:10 Bleeding: None Response to Treatment: Procedure was tolerated well Level of Consciousness Level of Consciousness Awake and Alert (Post-procedure): Post Debridement Measurements of Total Wound Length: (cm) 0.4 Stage: Unstageable/Unclassified Width: (cm) 0.3 Depth: (cm) 0.2 Volume: (cm) 0.019 Character of Wound/Ulcer Post Improved Debridement: Post Procedure Diagnosis Same as Pre-procedure Electronic Signature(s) Signed: 01/31/2020 5:29:37  PM By: Lenda Kelp PA-C Signed: 01/31/2020 5:43:44 PM By: Zenaida Deed RN, BSN Entered By: Zenaida Deed on 01/31/2020 10:10:38 Debridement Details Date of Service: -------------------------------------------------------------------------------- Jeffery [name] 01/31/2020 9:00 AM Patient Name: Jeffery Petit. Patient Account Number: [id number] Medical Record Treating RN: Zenaida Deed [id number] Number: Other Clinician: Date of Birth/Sex: 2001-02-03 (19 y.o. M) Treating Lenda Kelp Primary Care Provider: Dereck Leep Provider/Extender: Referring Provider: FLEMING, CHARLENE Weeks in Treatment: 0 Debridement Performed for Wound #4 Left Trochanter Assessment: Performed By: Clinician Yevonne Pax, RN Debridement Type: Chemical/Enzymatic/Mechanical Agent Used: Santyl Level of Consciousness (Pre- Awake and Alert procedure): Pre-procedure Verification/Time Out Taken: Yes - 10:10 Bleeding: None Response to Treatment: Procedure was tolerated well Level of Consciousness Awake and Alert (Post-procedure): Post Debridement Measurements of Total Wound Length: (cm) 1.1 Stage: Category/Stage III Width: (cm) 1.4 Depth: (cm) 0.2 Volume: (cm) 0.242 Character of Wound/Ulcer Post Improved Debridement: Post Procedure Diagnosis Same as Pre-procedure Electronic Signature(s) Signed: 01/31/2020 5:29:37 PM By: Lenda Kelp PA-C Signed: 01/31/2020 5:43:44 PM By: Zenaida Deed RN, BSN Entered By: Zenaida Deed on 01/31/2020 10:11:10 HPI Details Date of Service: -------------------------------------------------------------------------------- Jeffery [name] 01/31/2020 9:00 AM Patient Name: Jeffery Petit. Patient Account Number: [id number] Medical Record Treating RN: Zenaida Deed [id number] Number: Other Clinician: Date of Birth/Sex: [birth year] (19 y.o. M) Treating Lenda Kelp Primary Care Provider: Dereck Leep Provider/Extender: Referring Provider: FLEMING,  CHARLENE Weeks in Treatment: 0 History of Present Illness HPI Description: ADMISSION 02/21/2019 This is an 19 year old man who arrives in clinic accompanied by his mother. 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 He is totally dependent in all aspects of daily living and he has a PEG tube for nutrition. He apparently developed a pressure sore on the left buttock sometime in February. His mother started treating it with hydrogen peroxide and triple antibiotic therapy and offloading this as best as she could manage. He was seen by his pediatrician yesterday and referred to the emergency room at [facility]. Lab work in the hospital showed a white count of 6 a hemoglobin of 13.1 his basic metabolic panel was normal. An appointment  was made for him to be seen in our clinic today. He was prescribed Santyl. He has no prior wound history. 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 Prior back surgery Social; he has a hospital bed but he does not have an offloading surface. He has a wheelchair but apparently no specialty surface for the wheelchair either. The patient has Medicaid and he might be eligible for 1 of these 2 helpful surfaces as long as he has this wound 4/14. Patient with a wound on the left buttock. We have been using Santyl. They have been changing this multiple times a day because of urinary incontinence. They are requesting additional Santyl ALSO the patient has a new wound on the tip of his left great toe 4/28; wound on the left buttock actually looks quite good. We have been using Santyl to this area. He does have a divot in the middle of this but this does not probe to bone. The area that was new last week on the tip of his left great toe has a necrotic surface. I looked over what was available in  link. There is nothing in  reference to peripheral vascular testing or an echocardiogram. The patient has a history of mental retardation, quadriparetic cerebral palsy, asthma. He has a feeding tube in place. He has had surgery on his lumbar spine for spinal stabilization. The exact reason for this wound is not clear. 5/15; he continues to make nice improvements on the left buttock wound. We have been using Santyl. Patient had a new wound on the tip of his left great toe about a month ago. The cause of this is not really clear but obviously could be a pressure component. I cannot feel his posterior tibial or dorsalis pedis pulses however 5/29; left buttock wound is healed today. The area on the tip of his left great toe is also a lot better. They have been using Santyl to both wound areas. I have changed the primary dressing to the tip of the great toe to collagen 6/15; his mother assures me that the left buttock is remained healed. The area on his tip of his left great toe is also healed. The exact cause of this was not really clear. He has very difficult to feel peripheral pulses however there is absolutely no way he can undergo any sort of formal testing. The area did closed however  Readmission:01/31/2020 upon evaluation today patient actually appears to be doing quite well with regard to the wounds which I think is mother has been taking good care of based on what I am seeing. He is here because he has 2 new wounds. After he healed last time under the care of Dr. Dellia Nims apparently the air mattress that was obtained during that time was then taken due to the fact that he no longer had open wounds. Again he spends a statue amount of time in the bed and though his mother does try to offload and keep pressure off the area is very difficult sometimes to keep him exactly still in position where she has them. Obviously this is a risk for further breakdown which is why honestly he would really do well to have an ongoing air  mattress. However insurance will not pay for that with the lack of actual ulcers once they heal. Either way I think we may need to try to find something a little bit softer for him  possibly gel overlay that may be beneficial. Electronic Signature(s) Signed: 01/31/2020 10:17:51 AM By: Lenda Kelp PA-C Entered By: Lenda Kelp on 01/31/2020 10:17:51 Physical Exam Details Date of Service: -------------------------------------------------------------------------------- Jeffery [name] 01/31/2020 9:00 AM Patient Name: M. Patient Account Number: [id number] Medical Record Treating RN: Zenaida Deed [id number] Number: Other Clinician: Date of Birth/Sex: 2001-05-22 (19 y.o. M) Treating Lenda Kelp Primary Care Provider: Dereck Leep Provider/Extender: Referring Provider: FLEMING, CHARLENE Weeks in Treatment: 0 Constitutional pulse regular and within target range for patient.Marland Kitchen respirations regular, non-labored and within target range for patient.Marland Kitchen temperature within target range for patient.. Thin and well-hydrated in no acute distress. Respiratory normal breathing without difficulty. Psychiatric Patient is not able to cooperate in decision making regarding care. patient is confused. Notes Patient's wound bed currently showed signs of good granulation at this time. Fortunately there is no significant depth to the wounds there was some slough noted over both locations. With that being said I think that Santyl would likely be a good way to go for him. I did not perform any sharp debridement today it was somewhat dry and I was afraid that I would cause him more discomfort to be honest if I were to attempt debridement at this point. We may be able to do this more easily going forward in the future we shall see. Either way in general I think that he will likely heal well if we can get pressure off of the area possibly gel overlay could be beneficial for him. Electronic  Signature(s) Signed: 01/31/2020 10:18:45 AM By: Lenda Kelp PA-C Entered By: Lenda Kelp on 01/31/2020 10:18:45 Physician Orders Details Date of Service: -------------------------------------------------------------------------------- Jeffery [name] 01/31/2020 9:00 AM Patient Name: Jeffery Petit. Patient Account Number: [id number] Medical Record Treating RN: Zenaida Deed [id number] Number: Other Clinician: Date of Birth/Sex: 2001/04/09 (19 y.o. 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 Dressing Change Frequency Wound #3 Left Ilium Change dressing every day. Wound #4 Left Trochanter Change dressing every day. Wound Cleansing Wound #3 Left Ilium Clean wound with Wound Cleanser Wound #4 Left Trochanter Clean wound with Wound Cleanser Primary Wound Dressing Wound #3 Left Ilium Santyl Ointment Wound #4 Left Trochanter Santyl Ointment Secondary Dressing Wound #3 Left Ilium Foam Border - or large bandaid Wound #4 Left Trochanter Foam Border - or large bandaid Off-Loading Gel mattress overlay (Group 1) Turn and reposition every 2 hours Patient Medications Allergies: No Known Drug Allergies Notifications Medication Indication Start End Santyl 01/31/2020 DOSE topical 250 unit/gram ointment - ointment topical Apply nickel thick to the wound bed and then cover with a dressing as directed in clinic. Electronic Signature(s) Signed: 01/31/2020 10:20:57 AM By: Lenda Kelp PA-C Entered By: Lenda Kelp on 01/31/2020 10:20:56 Problem List Details Date of  Service: -------------------------------------------------------------------------------- Jeffery [name] 01/31/2020 9:00 AM Patient Name: M. Patient Account Number: [id number] Medical Record Treating RN: Zenaida Deed [id number] Number: Other Clinician: Date of Birth/Sex: March 18, 2001 (19 y.o. M) Treating Lenda Kelp Primary Care Provider: Dereck Leep Provider/Extender: Referring Provider: FLEMING, CHARLENE Weeks in Treatment: 0 Active Problems ICD-10 Evaluated Encounter Code Description Active Date Today Diagnosis L89.890 Pressure ulcer of other site, unstageable 01/31/2020 No Yes L89.223 Pressure  ulcer of left hip, stage 3 01/31/2020 No Yes G80.0 Spastic quadriplegic cerebral palsy 01/31/2020 No Yes F73 Profound intellectual disabilities 01/31/2020 No Yes Inactive Problems Resolved Problems Electronic Signature(s) Signed: 01/31/2020 10:03:27 AM By: Lenda Kelp PA-C Entered By: Lenda Kelp on 01/31/2020 10:03:27 Progress Note Details Date of Service: -------------------------------------------------------------------------------- Jeffery [name] 01/31/2020 9:00 AM Patient Name: Jeffery Petit. Patient Account Number: [id number] Medical Record Treating RN: Zenaida Deed [id number] Number: Other Clinician: Date of Birth/Sex: [birth year] (19 y.o. M) Treating Lenda Kelp Primary Care Provider: Dereck Leep Provider/Extender: Referring Provider: FLEMING, CHARLENE Weeks in Treatment: 0 Subjective Chief Complaint Information obtained from Patient Left Ilium and Left Trochanter Ulcers History of Present Illness (HPI) ADMISSION 02/21/2019 This is an 19 year old man who arrives in clinic accompanied by his mother. 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 He is totally dependent in all aspects of daily living and he has a PEG tube for nutrition. He apparently developed a pressure sore on the left buttock sometime in  February. His mother started treating it with hydrogen peroxide and triple antibiotic therapy and offloading this as best as she could manage. He was seen by his pediatrician yesterday and referred to the emergency room at [facility]. Lab work in the hospital showed a white count of 6 a hemoglobin of 13.1 his basic metabolic panel was normal. An appointment was made for him to be seen in our clinic today. He was prescribed Santyl. He has no prior wound history. 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 Prior back surgery Social; he has a hospital bed but he does not have an offloading surface. He has a wheelchair but apparently no specialty surface for the wheelchair either. The patient has Medicaid and he might be eligible for 1 of these 2 helpful surfaces as long as he has this wound 4/14. Patient with a wound on the left buttock. We have been using Santyl. They have been changing this multiple times a day because of urinary incontinence. They are requesting additional Trudie Buckler the patient has a new wound on the tip of his left great toe 4/28; wound on the left buttock actually looks quite good. We have been using Santyl to this area. He does have a divot in the middle of this but this does not probe to bone. ooThe area that was new last week on the tip of his left great toe has a necrotic surface. I looked over what was available in [place] link. There is nothing in reference to peripheral vascular testing or an echocardiogram. The patient has a history of mental retardation, quadriparetic cerebral palsy, asthma. He has a feeding tube in place. He has had surgery on his lumbar spine for spinal stabilization. The exact reason for this wound is not clear. 5/15; he continues to make nice improvements on the left buttock wound. We have been using Santyl. Patient had a new wound on  the tip of his left great toe about a month ago. The cause of this is not really clear but obviously could be a pressure component. I cannot feel his posterior tibial or dorsalis pedis pulses however 5/29; left buttock wound is healed today. The area on the tip of his left great toe is also a lot better. They have been using Santyl to both wound areas. I have changed the primary dressing to the tip of the great toe to collagen 6/15; his  mother assures me that the left buttock is remained healed. The area on his tip of his left great toe is also healed. The exact cause of this was not really clear. He has very difficult to feel peripheral pulses however there is absolutely no way he can undergo any sort of formal testing. The area did closed however  Readmission:01/31/2020 upon evaluation today patient actually appears to be doing quite well with regard to the wounds which I think is mother has been taking good care of based on what I am seeing. He is here because he has 2 new wounds. After he healed last time under the care of Dr. Leanord Hawkingobson apparently the air mattress that was obtained during that time was then taken due to the fact that he no longer had open wounds. Again he spends a statue amount of time in the bed and though his mother does try to offload and keep pressure off the area is very difficult sometimes to keep him exactly still in position where she has them. Obviously this is a risk for further breakdown which is why honestly he would really do well to have an ongoing air mattress. However insurance will not pay for that with the lack of actual ulcers once they heal. Either way I think we may need to try to find something a little bit softer for him possibly gel overlay that may be beneficial. Patient History Unable to Obtain Patient History due to Altered Mental Status. Allergies No Known Drug Allergies Family History Unknown History. Social History Never smoker, Marital Status -  Single, Alcohol Use - Never, Drug Use - No History, Caffeine Use - Never. Medical History Eyes Denies history of Cataracts, Glaucoma, Optic Neuritis Ear/Nose/Mouth/Throat Denies history of Chronic sinus problems/congestion, Middle ear problems Hematologic/Lymphatic Denies history of Anemia, Hemophilia, Human Immunodeficiency Virus, Lymphedema, Sickle Cell Disease Respiratory Patient has history of Asthma Denies history of Aspiration, Chronic Obstructive Pulmonary Disease (COPD), Pneumothorax, Sleep Apnea, Tuberculosis Cardiovascular Denies history of Angina, Arrhythmia, Congestive Heart Failure, Coronary Artery Disease, Deep Vein Thrombosis, Hypertension, Hypotension, Myocardial Infarction, Peripheral Arterial Disease, Peripheral Venous Disease, Phlebitis, Vasculitis Gastrointestinal Denies history of Cirrhosis , Colitis, Crohnoos, Hepatitis A, Hepatitis B, Hepatitis C Endocrine Denies history of Type I Diabetes, Type II Diabetes Genitourinary Denies history of End Stage Renal Disease Immunological Denies history of Lupus Erythematosus, Raynaudoos, Scleroderma Integumentary (Skin) Denies history of History of Burn Musculoskeletal Denies history of Gout, Rheumatoid Arthritis, Osteoarthritis, Osteomyelitis Neurologic Patient has history of Quadriplegia Denies history of Dementia, Neuropathy, Paraplegia, Seizure Disorder Oncologic Denies history of Received Chemotherapy, Received Radiation Psychiatric Denies history of Anorexia/bulimia, Confinement Anxiety Medical And Surgical History Notes Eyes congenital abnormality of left eye Gastrointestinal GERD, gastrostomy tube Musculoskeletal knee contractures Neurologic Cerebral Palsy, Microcephaly, Mental Retardation, Neuromuscular Scoliosis Review of Systems (ROS) Constitutional Symptoms (General Health) Denies complaints or symptoms of Fatigue, Fever, Chills, Marked Weight Change. Ear/Nose/Mouth/Throat Denies complaints or  symptoms of Chronic sinus problems or rhinitis. Cardiovascular Denies complaints or symptoms of Chest pain. Gastrointestinal Denies complaints or symptoms of Frequent diarrhea, Nausea, Vomiting. Endocrine Denies complaints or symptoms of Heat/cold intolerance. Genitourinary Denies complaints or symptoms of Frequent urination. Integumentary (Skin) Complains or has symptoms of Wounds - wound on left hip and ilium. Psychiatric Denies complaints or symptoms of Claustrophobia, Suicidal. Objective Constitutional pulse regular and within target range for patient.Marland Kitchen. respirations regular, non-labored and within target range for patient.Marland Kitchen. temperature within target range for patient.. Thin and well-hydrated in no acute distress. Vitals Time Taken: 9:24  AM, Source: Stated, Weight: 51.5 lbs, Source: Stated, Temperature: 98.5 F, Pulse: 94 bpm, Respiratory Rate: 16 breaths/min. General Notes: unable to obtain BP due to contractures Respiratory normal breathing without difficulty. Psychiatric Patient is not able to cooperate in decision making regarding care. patient is confused. General Notes: Patient's wound bed currently showed signs of good granulation at this time. Fortunately there is no significant depth to the wounds there was some slough noted over both locations. With that being said I think that Santyl would likely be a good way to go for him. I did not perform any sharp debridement today it was somewhat dry and I was afraid that I would cause him more discomfort to be honest if I were to attempt debridement at this point. We may be able to do this more easily going forward in the future we shall see. Either way in general I think that he will likely heal well if we can get pressure off of the area possibly gel overlay could be beneficial for him. Integumentary (Hair, Skin) Wound #3 status is Open. Original cause of wound was Pressure Injury. The wound is located on the Left Ilium.  The wound measures 0.4cm length x 0.3cm width x 0.2cm depth; 0.094cm^2 area and 0.019cm^3 volume. There is no tunneling or undermining noted. There is a small amount of serosanguineous drainage noted. The wound margin is flat and intact. There is no granulation within the wound bed. There is a large (67-100%) amount of necrotic tissue within the wound bed including Adherent Slough. Wound #4 status is Open. Original cause of wound was Pressure Injury. The wound is located on the Left Trochanter. The wound measures 1.1cm length x 1.4cm width x 0.2cm depth; 1.21cm^2 area and 0.242cm^3 volume. There is Fat Layer (Subcutaneous Tissue) Exposed exposed. There is no tunneling or undermining noted. There is a medium amount of serosanguineous drainage noted. The wound margin is flat and intact. There is medium (34-66%) pink granulation within the wound bed. There is a medium (34-66%) amount of necrotic tissue within the wound bed including Adherent Slough. 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 There was a Chemical/Enzymatic/Mechanical debridement performed by Yevonne Pax, RN.Marland Kitchen Agent used was [employer]. A time out was conducted at 10:10, prior to the start of the procedure. There was no bleeding. The procedure was tolerated well. Post Debridement Measurements: 0.4cm length x 0.3cm width x 0.2cm depth; 0.019cm^3 volume. Post debridement Stage noted as Unstageable/Unclassified. Character of Wound/Ulcer Post Debridement is improved. Post procedure Diagnosis Wound #3: Same as Pre-Procedure Wound #4 Pre-procedure diagnosis of Wound #4 is a Pressure Ulcer located on the Left Trochanter . There was a Chemical/Enzymatic/Mechanical debridement performed by Yevonne Pax, RN.Marland Kitchen Agent used was [employer]. A time out was conducted at 10:10, prior to the start of the procedure. There was no bleeding. The procedure was tolerated well. Post Debridement Measurements: 1.1cm length x 1.4cm width x 0.2cm depth; 0.242cm^3 volume. Post debridement Stage noted as Category/Stage III. Character of Wound/Ulcer Post Debridement is improved. Post procedure Diagnosis Wound #4: Same as Pre-Procedure Plan Follow-up Appointments: Return Appointment in 2 weeks. Dressing Change Frequency: Wound #3 Left Ilium: Change dressing every day. Wound #4 Left Trochanter: Change dressing every day. Wound Cleansing: Wound #3 Left Ilium: Clean wound with Wound Cleanser Wound #4  Left Trochanter: Clean wound with Wound Cleanser Primary Wound Dressing: Wound #3 Left Ilium: Santyl Ointment Wound #4 Left Trochanter: Santyl Ointment Secondary Dressing: Wound #3 Left Ilium: Foam Border - or large bandaid Wound #4 Left Trochanter: Foam Border - or large bandaid Off-Loading: Gel mattress overlay (Group 1) Turn and reposition every 2 hours The following medication(s) was prescribed: Santyl topical 250 unit/gram ointment ointment topical Apply nickel thick to the wound bed and then cover with a dressing as directed in clinic. starting 01/31/2020 1. I would recommend currently that we go ahead and initiate treatment with the gel overlay mattress we will try to get this done for him soon as possible. If that does not help the areas to heal we can always go back to the air mattress the problem is he is not can have this ongoing. 2. I am also going to recommend that the patient continue with a treatment utilizing the Santyl I think the Santyl is a good way to go and subsequently this should be applied nickel thick with a saline moistened gauze over top of that followed by a border foam dressing for protection. 3. I am in a recommend that continued offloading and repositioning at least every 2  hours his mother states that she does do that and she tried to be careful but she does feel like a mattress Topper or something would help out as the current mattress is very hard. We will see patient back for reevaluation in 1 week here in the clinic. If anything worsens or changes patient will contact our office for additional recommendations. Electronic Signature(s) Signed: 01/31/2020 10:21:10 AM By: Lenda Kelp PA-C Entered By: Lenda Kelp on 01/31/2020 10:21:10 HxROS Details Date of Service: -------------------------------------------------------------------------------- Jeffery [name] 01/31/2020 9:00 AM Patient Name: M. Patient Account Number: [id number] Medical Record Treating RN: Jeffery [name] [id number] Number: Other Clinician: Date of Birth/Sex: [birth year] (19 y.o. M) Treating Lenda Kelp Primary Care Provider: Dereck Leep Provider/Extender: Referring Provider: FLEMING, CHARLENE Weeks in Treatment: 0 Unable to Obtain Patient History due to Altered Mental Status Constitutional Symptoms (General Health) Complaints and Symptoms: Negative for: Fatigue; Fever; Chills; Marked Weight Change Ear/Nose/Mouth/Throat Complaints and Symptoms: Negative for: Chronic sinus problems or rhinitis Medical History: Negative for: Chronic sinus problems/congestion; Middle ear problems Cardiovascular Complaints and Symptoms: Negative for: Chest pain Medical History: Negative for: Angina; Arrhythmia; Congestive Heart Failure; Coronary Artery Disease; Deep Vein Thrombosis; Hypertension; Hypotension; Myocardial Infarction; Peripheral Arterial Disease; Peripheral Venous Disease; Phlebitis; Vasculitis Gastrointestinal Complaints and Symptoms: Negative for: Frequent diarrhea; Nausea; Vomiting Medical History: Negative for: Cirrhosis ; Colitis; Crohns; Hepatitis A; Hepatitis B; Hepatitis C Past Medical History Notes: GERD, gastrostomy tube Endocrine Complaints and  Symptoms: Negative for: Heat/cold intolerance Medical History: Negative for: Type I Diabetes; Type II Diabetes Genitourinary Complaints and Symptoms: Negative for: Frequent urination Medical History: Negative for: End Stage Renal Disease Integumentary (Skin) Complaints and Symptoms: Positive for: Wounds - wound on left hip and ilium Medical History: Negative for: History of Burn Psychiatric Complaints and Symptoms: Negative for: Claustrophobia; Suicidal Medical History: Negative for: Anorexia/bulimia; Confinement Anxiety Eyes Medical History: Negative for: Cataracts; Glaucoma; Optic Neuritis Past Medical History Notes: congenital abnormality of left eye Hematologic/Lymphatic Medical History: Negative for: Anemia; Hemophilia; Human Immunodeficiency Virus; Lymphedema; Sickle Cell Disease Respiratory Medical History: Positive for: Asthma Negative for: Aspiration; Chronic Obstructive Pulmonary Disease (COPD); Pneumothorax; Sleep Apnea; Tuberculosis Immunological Medical History: Negative for: Lupus Erythematosus; Raynauds; Scleroderma Musculoskeletal Medical History: Negative for: Gout; Rheumatoid Arthritis; Osteoarthritis; Osteomyelitis Past  Medical History Notes: knee contractures Neurologic Medical History: Positive for: Quadriplegia Negative for: Dementia; Neuropathy; Paraplegia; Seizure Disorder Past Medical History Notes: Cerebral Palsy, Microcephaly, Mental Retardation, Neuromuscular Scoliosis Oncologic Medical History: Negative for: Received Chemotherapy; Received Radiation Immunizations Pneumococcal Vaccine: Received Pneumococcal Vaccination: No Implantable Devices None Family and Social History Unknown History: Yes; Never smoker; Marital Status - Single; Alcohol Use: Never; Drug Use: No History; Caffeine Use: Never; Financial Concerns: No; Food, Clothing or Shelter Needs: No; Support System Lacking: No; Transportation Concerns: No Electronic  Signature(s) Signed: 01/31/2020 5:29:37 PM By: Lenda Kelp PA-C Signed: 02/05/2020 5:44:18 PM By: Jeffery Abts RN, BSN Entered By: Jeffery [name] on 01/31/2020 09:37:49 -------------------------------------------------------------------------------- SuperBill Details Patient Name: Date of Service: Jeffery [name], Jeffery [name] 01/31/2020 Medical Record [id number] Patient Account Number: [id number] Date of Birth/Sex: Treating RN: Jun 12, 2001 (18 y.o. 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 Facility Procedures CPT4 Code: 43329518 Description: 99213 - WOUND CARE VISIT-LEV 3 EST PT Modifier: 25 Quantity: 1 CPT4 Code: [id number] Description: 01601 - DEBRIDE W/O ANES NON SELECT Modifier: Quantity: 1 Physician Procedures CPT4 Code: [id number] Description: 99214 - WC PHYS LEVEL 4 - EST PT ICD-10 Diagnosis Description L89.890 Pressure ulcer of other site, unstageable 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

## 2020-02-05 NOTE — Progress Notes (Signed)
NEWTON, FRUTIGER ([id number]) Visit Report for 01/31/2020 Abuse/Suicide Risk Screen Details Date of Service: ISIAC, BREIGHNER 01/31/2020 9:00 AM Patient Name: M. Patient Account Number: [id number] Medical Record Treating RN: Levan Hurst [id number] Number: Other Clinician: Date of Birth/Sex: Oct 25, 2001 (19 y.o. M) Treating Worthy Keeler Primary Care Panhia Karl: Ottie Glazier Yeva Bissette/Extender: Referring Charlee Whitebread: FLEMING, CHARLENE Weeks in Treatment: 0 Abuse/Suicide Risk Screen Items Answer ABUSE RISK SCREEN: Has anyone close to you tried to hurt or harm you recentlyo No Do you feel uncomfortable with anyone in your familyo No Has anyone forced you do things that you didnt want to doo No Electronic Signature(s) Signed: 02/05/2020 5:44:18 PM By: Levan Hurst RN, BSN Entered By: Levan Hurst on 01/31/2020 09:24:59 Activities of Daily Living Details Date of Service: -------------------------------------------------------------------------------- HARJOT, DIBELLO 01/31/2020 9:00 AM Patient Name: M. Patient Account Number: [id number] Medical Record Treating RN: Levan Hurst [id number] Number: Other Clinician: Date of Birth/Sex: 06-28-2001 (19 y.o. M) Treating Worthy Keeler Primary Care Presleigh Feldstein: Ottie Glazier Mamie Hundertmark/Extender: Referring Damaris Geers: FLEMING, CHARLENE Weeks in Treatment: 0 Activities of Daily Living Items Answer Activities of Daily Living (Please select one for each item) Drive Automobile Not Able Take Medications Not Able Use Telephone Not Able Care for Appearance Not Able Use Toilet Not Able Bath / Shower Not Able Dress Self Not Able Feed Self Not Able Walk Not Able Get In / Out Bed Not Able Housework Not Able Prepare Meals Not Able Handle Money Not Able Shop for Self Not Able Electronic Signature(s) Signed: 02/05/2020 5:44:18 PM By: Levan Hurst RN, BSN Entered By: Levan Hurst on 01/31/2020 09:25:23 Education  Screening Details Date of Service: -------------------------------------------------------------------------------- Ross Ludwig 01/31/2020 9:00 AM Patient Name: Jeffery Mages. Patient Account Number: [id number] Medical Record Treating RN: Levan Hurst [id number] Number: Other Clinician: Date of Birth/Sex: Jul 05, 2001 (19 y.o. M) Treating Worthy Keeler Primary Care Nell Schrack: Ottie Glazier Erling Arrazola/Extender: Referring Ryliee Figge: FLEMING, CHARLENE Weeks in Treatment: 0 Primary Learner Assessed: Caregiver Mother Cerebral Palsy, Mental Reason Patient is not Primary Learner: Retardation Learning Preferences/Education Level/Primary Language Learning Preference: Explanation, Demonstration, Printed Material Highest Education Level: Grade School Preferred Language: Spanish; Product/process development scientist Language Barrier: Yes Non English Financial planner Needed: Yes Hospital Employed Language Interpreter Memory Deficit: No Emotional Barrier: No Cultural/Religious Beliefs Affecting Medical Care: No Physical Barrier Impaired Vision: No Impaired Hearing: No Decreased Hand dexterity: No Knowledge/Comprehension Knowledge Level: High Comprehension Level: High Ability to understand written High instructions: Ability to understand verbal High instructions: Motivation Anxiety Level: Calm Cooperation: Cooperative Education Importance: Acknowledges Need Interest in Health Problems: Asks Questions Perception: Coherent Willingness to Engage in Self- High Management Activities: Readiness to Engage in Self- High Management Activities: Electronic Signature(s) Signed: 02/05/2020 5:44:18 PM By: Levan Hurst RN, BSN Entered By: Levan Hurst on 01/31/2020 09:41:09 Fall Risk Assessment Details Date of Service: -------------------------------------------------------------------------------- Ross Ludwig 01/31/2020 9:00 AM Patient Name: Jeffery Mages. Patient Account Number:  [id number] Medical Record Treating RN: Levan Hurst [id number] Number: Other Clinician: Date of Birth/Sex: Jul 28, 2001 (19 y.o. M) Treating Worthy Keeler Primary Care Shahin Knierim: Ottie Glazier Conlin Brahm/Extender: Referring Keyan Folson: FLEMING, CHARLENE Weeks in Treatment: 0 Fall Risk Assessment Items Have you had 2 or more falls in the last 12 monthso 0 No Have you had any fall that resulted in injury in the last 12 monthso 0 No FALLS RISK SCREEN History of falling - immediate or within 3 months 0 No Secondary diagnosis (Do you have 2 or more medical diagnoseso) 0 No Ambulatory aid None/bed rest/wheelchair/nurse 0 No Crutches/cane/walker 0 No Furniture 0  No Intravenous therapy Access/Saline/Heparin Lock 0 No Gait/Transferring Normal/ bed rest/ wheelchair 0 No Impaired (short steps with shuffle, may have difficulty arising from chair, 0 No head down, impaired balance) Mental Status Oriented to own ability 0 No Overestimates or forgets limitations 0 No Risk Level: Low Risk Score: 0 Electronic Signature(s) Signed: 02/05/2020 5:44:18 PM By: Zandra Abts RN, BSN Entered By: Zandra Abts on 01/31/2020 09:41:27 Nutrition Risk Screening Details Date of Service: -------------------------------------------------------------------------------- [name] 01/31/2020 9:00 AM Patient Name: Jeffery Petit. Patient Account Number: [id number] Medical Record Treating RN: Zandra Abts [id number] Number: Other Clinician: Date of Birth/Sex: 2001/02/23 (19 y.o. M) Treating Lenda Kelp Primary Care Lue Sykora: Dereck Leep Tuwanda Vokes/Extender: Referring Ayvah Caroll: FLEMING, CHARLENE Weeks in Treatment: 0 Height (in): Weight (lbs): 51.5 Body Mass Index (BMI): Nutrition Risk Screening Items Score Screening NUTRITION RISK SCREEN: I have an illness or condition that made me change the kind and/or 2 Yes amount of food I eat I eat fewer than two meals per day 0 No I eat few fruits and  vegetables, or milk products 0 No I have three or more drinks of beer, liquor or wine almost every day 0 No I have tooth or mouth problems that make it hard for me to eat 0 No I don't always have enough money to buy the food I need 0 No I eat alone most of the time 0 No I take three or more different prescribed or over-the-counter drugs a day 0 No 0 No Without wanting to, I have lost or gained 10 pounds in the last six months I am not always physically able to shop, cook and/or feed myself 2 Yes Nutrition Protocols Good Risk Protocol Provide education on Moderate Risk Protocol 0 nutrition High Risk Proctocol Risk Level: Moderate Risk Score: 4 Electronic Signature(s) Signed: 02/05/2020 5:44:18 PM By: Zandra Abts RN, BSN Entered By: Zandra Abts on 01/31/2020 09:26:01

## 2020-02-05 NOTE — Progress Notes (Addendum)
NORVIN, OHLIN ([id number]) Visit Report for 01/31/2020 Allergy List Details Date of Service: Jeffery [name] 01/31/2020 9:00 AM Patient Name: M. Patient Account Number: [id number] Medical Record Treating RN: Zandra Abts [id number] Number: Other Clinician: Date of Birth/Sex: November 15, 2001 (19 y.o. M) Treating Lenda Kelp Primary Care Jameia Makris: Dereck Leep Thierry Dobosz/Extender: Referring Timika Muench: FLEMING, CHARLENE Weeks in Treatment: 0 Allergies Active Allergies No Known Drug Allergies Allergy Notes Electronic Signature(s) Signed: 02/05/2020 5:44:18 PM By: Zandra Abts RN, BSN Entered By: Zandra Abts on 01/31/2020 09:24:53 Arrival Information Details Date of Service: -------------------------------------------------------------------------------- Jeffery [name] 01/31/2020 9:00 AM Patient Name: Jeffery [name]. Patient Account Number: [id number] Medical Record Treating RN: Zandra Abts [id number] Number: Other Clinician: Date of Birth/Sex: 2001/05/26 (19 y.o. M) Treating Lenda Kelp Primary Care Heavenlee Maiorana: Dereck Leep Alberta Lenhard/Extender: Referring Talicia Sui: FLEMING, CHARLENE Weeks in Treatment: 0 Visit Information History Since Last Visit Added or deleted any medications: No Patient Arrived: Wheel Chair Any new allergies or adverse reactions: No Arrival Time: 09:18 Had a fall or experienced change in No Accompanied By: mother activities of daily living that may affect Transfer Assistance: None risk of falls: Patient Identification Verified: Yes Signs or symptoms of abuse/neglect since last No Secondary Verification Process Yes visito Completed: Hospitalized since last visit: No Patient Requires Transmission-Based No Implantable device outside of the clinic excluding No Precautions: Precautions: cellular tissue based products placed in the center Patient Has Alerts: Yes since last visit: Patient Alerts: Translator Pain Present Now:  No Required Electronic Signature(s) Signed: 02/05/2020 5:44:18 PM By: Zandra Abts RN, BSN Entered By: Zandra Abts on 01/31/2020 09:38:40 Clinic Level of Care Assessment Details Date of Service: -------------------------------------------------------------------------------- Jeffery [name] 01/31/2020 9:00 AM Patient Name: M. Patient Account Number: [id number] Medical Record Treating RN: Zenaida Deed [id number] Number: Other Clinician: Date of Birth/Sex: 25-May-2001 (19 y.o. M) Treating Lenda Kelp Primary Care Desa Rech: Dereck Leep Zoria Rawlinson/Extender: Referring Jivan Symanski: FLEMING, CHARLENE Weeks in Treatment: 0 Clinic Level of Care Assessment Items TOOL 1 Quantity Score []  - Use when EandM and Procedure is performed on INITIAL visit 0 ASSESSMENTS - Nursing Assessment / Reassessment X - General Physical Exam (combine w/ comprehensive assessment (listed just below) 1 20 when performed on new pt. evals) X - Comprehensive Assessment (HX, ROS, Risk Assessments, Wounds Hx, etc.) 1 25 ASSESSMENTS - Wound and Skin Assessment / Reassessment []  - Dermatologic / Skin Assessment (not related to wound area) 0 ASSESSMENTS - Ostomy and/or Continence Assessment and Care []  - Incontinence Assessment and Management 0 []  - Ostomy Care Assessment and Management (repouching, etc.) 0 PROCESS - Coordination of Care X - Simple Patient / Family Education for ongoing care 1 15 []  - Complex (extensive) Patient / Family Education for ongoing care 0 X - Staff obtains , Records, Test Results / Process Orders 1 10 []  - Staff telephones HHA, Nursing Homes / Clarify orders / etc 0 []  - Routine Transfer to another Facility (non-emergent condition) 0 []  - Routine Hospital Admission (non-emergent condition) 0 X - New Admissions / / Ordering NPWT, Apligraf, etc. 1 15 []  - Emergency Hospital Admission (emergent condition) 0 PROCESS - Special Needs []  -  Pediatric / Minor Patient Management 0 []  - Isolation Patient Management 0 []  - Hearing / Language / Visual special needs 0 []  - Assessment of Community assistance (transportation, D/C planning, etc.) 0 []  - Additional assistance / Altered mentation 0 []  - Support Surface(s) Assessment (bed, cushion, seat, etc.) 0 INTERVENTIONS - Miscellaneous []  - External ear exam 0 []  - Patient  Transfer (multiple staff / Civil Service fast streamer / Similar devices) 0 []  - Simple Staple / Suture removal (25 or less) 0 []  - Complex Staple / Suture removal (26 or more) 0 []  - Hypo/Hyperglycemic Management (do not check if billed separately) 0 []  - Ankle / Brachial Index (ABI) - do not check if billed separately 0 Has the patient been seen at the hospital within the last three years: Yes Total Score: 85 Level Of Care: New/Established - Level 3 Electronic Signature(s) Signed: 01/31/2020 5:43:44 PM By: Baruch Gouty RN, BSN Entered By: Baruch Gouty on 01/31/2020 10:09:33 Encounter Discharge Information Details Date of Service: -------------------------------------------------------------------------------- Ross Ludwig 01/31/2020 9:00 AM Patient Name: Jeffery [name]. Patient Account Number: [id number] Medical Record Treating RN: Carlene Coria [id number] Number: Other Clinician: Date of Birth/Sex: 07-02-2001 (19 y.o. M) Treating Worthy Keeler Primary Care Deronda Christian: Ottie Glazier [place]oia Mincey/Extender: Referring Kassius Battiste: FLEMING, CHARLENE Weeks in Treatment: 0 Encounter Discharge Information Items Post Procedure Vitals Discharge Condition: Stable Temperature (F): 98.5 Ambulatory Status: Wheelchair Pulse (bpm): 94 Discharge Destination: Home Respiratory Rate 16 (breaths/min): Transportation: Private Auto Unable to obtain vitals unable to obtain BP due to Accompanied By: mother Reason: contractures Schedule Follow-up Appointment: Yes Clinical Summary of Care: Patient Declined Electronic Signature(s) Signed:  01/31/2020 5:35:23 PM By: Carlene Coria RN Entered By: Carlene Coria on 01/31/2020 10:25:38 [place] Details Date of Service: -------------------------------------------------------------------------------- Ross Ludwig 01/31/2020 9:00 AM Patient Name: Jeffery [name]. Patient Account Number: [id number] Medical Record Treating RN: Baruch Gouty [id number] Number: Other Clinician: Date of Birth/Sex: 08-18-2001 (19 y.o. M) Treating Worthy Keeler Primary Care Derrel Moore: Ottie Glazier Valencia Kassa/Extender: Referring Minette Manders: FLEMING, CHARLENE Weeks in Treatment: 0 Active Inactive Nutrition Nursing Diagnoses: Potential for alteratiion in Nutrition/Potential for imbalanced nutrition Goals: Patient/caregiver agrees to and verbalizes understanding of need to use nutritional supplements and/or vitamins as prescribed Date Initiated: 01/31/2020 Target Resolution Date: 02/28/2020 Goal Status: Active Interventions: Assess patient nutrition upon admission and as needed per policy Treatment Activities: Patient referred to Primary Care Physician for further nutritional evaluation : 01/31/2020 Notes: Pressure Nursing Diagnoses: Knowledge deficit related to management of pressures ulcers Potential for impaired tissue integrity related to pressure, friction, moisture, and shear Goals: Patient/caregiver will verbalize understanding of pressure ulcer management Date Initiated: 01/31/2020 Target Resolution Date: 02/28/2020 Goal Status: Active Interventions: Assess: immobility, friction, shearing, incontinence upon admission and as needed Assess offloading mechanisms upon admission and as needed Notes: Wound/Skin Impairment Nursing Diagnoses: Impaired tissue integrity Knowledge deficit related to ulceration/compromised skin integrity Goals: Patient/caregiver will verbalize understanding of skin care regimen Date Initiated: 01/31/2020 Target Resolution Date: 02/28/2020 Goal Status:  Active Ulcer/skin breakdown will have a volume reduction of 30% by week 4 Date Initiated: 01/31/2020 Target Resolution Date: 02/28/2020 Goal Status: Active Interventions: Assess patient/caregiver ability to obtain necessary supplies Assess patient/caregiver ability to perform ulcer/skin care regimen upon admission and as needed Assess ulceration(s) every visit Provide education on ulcer and skin care Treatment Activities: Skin care regimen initiated : 01/31/2020 Topical wound management initiated : 01/31/2020 Notes: Electronic Signature(s) Signed: 01/31/2020 5:43:44 PM By: Baruch Gouty RN, BSN Entered By: Baruch Gouty on 01/31/2020 09:55:52 Pain Assessment Details Date of Service: -------------------------------------------------------------------------------- Ross Ludwig 01/31/2020 9:00 AM Patient Name: Jeffery [name]. Patient Account Number: [id number] Medical Record Treating RN: Levan Hurst [id number] Number: Other Clinician: Date of Birth/Sex: 2000/12/04 (19 y.o. M) Treating Worthy Keeler Primary Care Joplin Canty: Ottie Glazier Josue Falconi/Extender: Referring Melven Stockard: FLEMING, CHARLENE Weeks in Treatment: 0 Active Problems Location of Pain Severity and Description of Pain Patient Has Paino No Site  Locations Pain Management and Medication Current Pain Management: Electronic Signature(s) Signed: 02/05/2020 5:44:18 PM By: Zandra Abts RN, BSN Entered By: Zandra Abts on 01/31/2020 09:44:27 -------------------------------------------------------------------------------- Patient/Caregiver Education Details Jeffery [name] 3/10[birth year]21andnbsp9:00 Patient Name: Date of Service: M. AM Medical Record Patient Account Number: [id number] Number: Treating RN: Zenaida Deed Date of Birth/Gender: 04-06-2001 (19 y.o. M) Other Clinician: Primary Care Treating FLEMING, Lance Sell Physician: Physician/Extender: Referring Physician: Watt Climes in Treatment: 0 Education Assessment Education Provided To: Patient Education Topics Provided Pressure: Handouts: Presure Ulcers: Care and Offloading Spanish, Presure Ulcers: Care and Offloading Spanish 2, Preventing Pressure Ulcers Spanish Methods: Explain/Verbal, Printed Responses: Reinforcements needed, State content correctly [facility]: Handouts: [facility] Spanish Methods: Explain/Verbal, Printed Responses: Reinforcements needed, State content correctly Wound/Skin Impairment: Handouts: Caring for Your Ulcer Spanish, Skin Care Do's and Dont's Spanish Methods: Explain/Verbal, Printed Responses: Reinforcements needed, State content correctly Electronic Signature(s) Signed: 01/31/2020 5:43:44 PM By: Zenaida Deed RN, BSN Entered By: Zenaida Deed on 01/31/2020 09:56:40 Wound Assessment Details Date of Service: -------------------------------------------------------------------------------- Jeffery [name] 01/31/2020 9:00 AM Patient Name: Jeffery [name]. Patient Account Number: [id number] Medical Record Treating RN: Zenaida Deed [id number] Number: Other Clinician: Date of Birth/Sex: 2001/05/19 (19 y.o. M) Treating Lenda Kelp Primary Care Alayza Pieper: Dereck Leep Wei Poplaski/Extender: Referring Franceen Erisman: FLEMING, CHARLENE Weeks in Treatment: 0 Wound Status Wound Number: 3 Primary Etiology: Pressure Ulcer Wound Location: Left Ilium Wound Status: Open Wounding Event: Pressure Injury Comorbid History: Asthma, Quadriplegia Date Acquired: 01/22/2020 Weeks Of Treatment: 0 Clustered Wound: No Photos Wound Measurements Length: (cm) 0.4 % Reduction in Area: 0% Width: (cm) 0.3 % Reduction in Volume: 0% Depth: (cm) 0.2 Epithelialization: None Area: (cm) 0.094 Tunneling: No Volume: (cm) 0.019 Undermining: No Wound Description Classification: Unstageable/Unclassified Foul Odor After Cleansing: No Wound  Margin: Flat and Intact Slough/Fibrino No Exudate Amount: Small Exudate Type: Serosanguineous Exudate Color: red, brown Wound Bed Granulation Amount: None Present (0%) Exposed Structure Necrotic Amount: Large (67-100%) Fascia Exposed: No Necrotic Quality: Adherent Slough Fat Layer (Subcutaneous Tissue) Exposed: No Tendon Exposed: No Muscle Exposed: No Joint Exposed: No Bone Exposed: No Electronic Signature(s) Signed: 02/06/2020 4:45:30 PM By: Benjaman Kindler EMT/HBOT Signed: 02/06/2020 5:21:02 PM By: Zenaida Deed RN, BSN Previous Signature: 02/05/2020 5:44:18 PM Version By: Zandra Abts RN, BSN Entered By: Benjaman Kindler on 02/06/2020 15:47:47 Wound Assessment Details Date of Service: -------------------------------------------------------------------------------- Jeffery [name] 01/31/2020 9:00 AM Patient Name: Jeffery [name]. Patient Account Number: [id number] Medical Record Treating RN: Zandra Abts [id number] Number: Other Clinician: Date of Birth/Sex: [birth year] (19 y.o. M) Treating Lenda Kelp Primary Care Timmi Devora: Dereck Leep Sheniya Garciaperez/Extender: Referring Lydian Chavous: FLEMING, CHARLENE Weeks in Treatment: 0 Wound Status Wound Number: 4 Primary Etiology: Pressure Ulcer Wound Location: Left Trochanter Wound Status: Open Wounding Event: Pressure Injury Comorbid History: Asthma, Quadriplegia Date Acquired: 01/22/2020 Weeks Of Treatment: 0 Clustered Wound: No Photos Wound Measurements Length: (cm) 1.1 % Reduction in Area: 0% Width: (cm) 1.4 % Reduction in Volume: 0% Depth: (cm) 0.2 Epithelialization: None Area: (cm) 1.21 Tunneling: No Volume: (cm) 0.242 Undermining: No Wound Description Classification: Category/Stage III Foul Odor After Cleansing: No Wound Margin: Flat and Intact Slough/Fibrino Yes Exudate Amount: Medium Exudate Type: Serosanguineous Exudate Color: red, brown Wound Bed Granulation Amount: Medium (34-66%) Exposed Structure Granulation  Quality: Pink Fascia Exposed: No Necrotic Amount: Medium (34-66%) Fat Layer (Subcutaneous Tissue) Exposed: Yes Necrotic Quality: Adherent Slough Tendon Exposed: No Muscle Exposed: No Joint Exposed: No Bone Exposed: No Electronic Signature(s) Signed: 02/06/2020 4:45:30  PM By: Benjaman Kindler EMT/HBOT Signed: 03/06/2020 9:04:28 AM By: Zandra Abts RN, BSN Previous Signature: 02/05/2020 5:44:18 PM Version By: Zandra Abts RN, BSN Entered By: Benjaman Kindler on 02/06/2020 15:47:15 Vitals Details Date of Service: -------------------------------------------------------------------------------- Jeffery [name] 01/31/2020 9:00 AM Patient Name: Jeffery [name]. Patient Account Number: [id number] Medical Record Treating RN: Zandra Abts [id number] Number: Other Clinician: Date of Birth/Sex: [birth year] (19 y.o. M) Treating Lenda Kelp Primary Care Quantarius Genrich: Dereck Leep Dylon Correa/Extender: Referring Naiomi Musto: FLEMING, CHARLENE Weeks in Treatment: 0 Vital Signs Time Taken: 09:24 Temperature (F): 98.5 Source: Stated Pulse (bpm): 94 Weight (lbs): 51.5 Respiratory Rate (breaths/min): 16 Source: Stated Reference Range: 80 - 120 mg / dl Notes unable to obtain BP due to contractures Electronic Signature(s) Signed: 02/05/2020 5:44:18 PM By: Zandra Abts RN, BSN Entered By: Zandra Abts on 01/31/2020 09:38:25

## 2020-02-07 ENCOUNTER — Encounter (HOSPITAL_BASED_OUTPATIENT_CLINIC_OR_DEPARTMENT_OTHER): Payer: Medicaid Other | Admitting: Physician Assistant

## 2020-02-08 ENCOUNTER — Telehealth: Payer: Self-pay | Admitting: Licensed Clinical Social Worker

## 2020-02-08 NOTE — Telephone Encounter (Signed)
To address the questions Jeffery [name] brought up, if some can call Jeffery [name] back to let her know that I have had patients get nutritional supplements like Jeffery [name] from Jeffery [name].   I did not know he had a "feeding tube", unless mother needs him evaluated with someone in adult medicine for problems with feeding, please also mention this to Jeffery [name].   I receive many forms for Jeffery [name], and if one was sent to [place], I must have aleady signed it because I don't have any forms for him in my in basket now.  Thank you   Dr. [name]

## 2020-02-08 NOTE — Telephone Encounter (Signed)
Spoke with Cloria Spring regarding Patient's approval for CAP services.  Victorino Dike stated that he has been approved but they still need to get the name of the supplier for his Boost and feeding tubes (I don't know where we link Patient's to for those kind of supplies).  She also wanted to follow up on a form she sent last week for the PCP to sign.  Her phone number if [phone] and the fax number to return the form to is [phone]. Please let me know if I can help any further.

## 2020-02-09 ENCOUNTER — Telehealth: Payer: Self-pay | Admitting: Licensed Clinical Social Worker

## 2020-02-09 NOTE — Telephone Encounter (Signed)
Clinician received another call from Cloria Spring regarding Patient's G tube and boost supplier.  I shared information in the  Chart from Dr. [name] regarding provider of Boost we have used.  I also provided information for Dr. Frutoso Chase as he appears to address Pt needs for G tube supplies.

## 2020-02-14 ENCOUNTER — Ambulatory Visit (HOSPITAL_BASED_OUTPATIENT_CLINIC_OR_DEPARTMENT_OTHER): Payer: Medicaid Other | Admitting: Physician Assistant

## 2020-02-14 NOTE — Telephone Encounter (Signed)
CALLED AND SPOKE WITH JENNIFER TODAY, SHE STATES EVERTING IS GOOD TO GO AND THE NOTE CAN NOW BE CLOSED

## 2020-02-21 ENCOUNTER — Other Ambulatory Visit: Payer: Self-pay

## 2020-02-21 ENCOUNTER — Encounter (HOSPITAL_BASED_OUTPATIENT_CLINIC_OR_DEPARTMENT_OTHER): Payer: Medicaid Other | Admitting: Physician Assistant

## 2020-02-21 DIAGNOSIS — L89223 Pressure ulcer of left hip, stage 3: Secondary | ICD-10-CM | POA: Diagnosis not present

## 2020-02-21 NOTE — Progress Notes (Addendum)
GOLDIE, DIMMER ([id number]) Visit Report for 02/21/2020 Chief Complaint Document Details ARHAAN, CHESNUT 02/21/2020 11:00 Patient Name: Date of Service: M. AM Medical Record Patient Account Number: [id number] Number: Treating RN: Baruch Gouty Date of Birth/Sex: 2001-08-30 (18 y.o. M) Other Clinician: Primary Care Provider: FLEMING, CHARLENE Treating Worthy Keeler Referring Provider: Provider/Extender: FLEMING, CHARLENE Weeks in Treatment: 3 Information Obtained from: Patient Chief Complaint Left Ilium and Left Trochanter Ulcers Electronic Signature(s) Signed: 02/21/2020 10:59:46 AM By: Worthy Keeler PA-C Entered By: Worthy Keeler on 02/21/2020 10:59:45 -------------------------------------------------------------------------------- HPI Details MICHAIL, BOYTE 02/21/2020 11:00 Patient Name: Date of Service: M. AM Medical Record Patient Account Number: [id number] Number: Treating RN: Baruch Gouty Date of Birth/Sex: 2001/07/25 (18 y.o. M) Other Clinician: Primary Care Provider: FLEMING, CHARLENE Treating Worthy Keeler Referring Provider: Provider/Extender: FLEMING, CHARLENE Weeks in Treatment: 3 History of Present Illness HPI Description: ADMISSION 02/21/2019 This is an 19 year old man who arrives in clinic accompanied by his mother. 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 He is totally dependent in all aspects of daily living and he has a PEG tube for nutrition. He apparently developed a pressure sore on the left buttock sometime in February. His mother started treating it with hydrogen peroxide and triple antibiotic therapy and offloading this as best as she could manage. He was seen by his pediatrician yesterday and referred to the emergency room at [facility]. Lab work in the hospital showed a white count of 6 a hemoglobin of 13.1 his basic metabolic panel was normal. An  appointment was made for him to be seen in our clinic today. He was prescribed Santyl. He has no prior wound history. 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 Prior back surgery Social; he has a hospital bed but he does not have an offloading surface. He has a wheelchair but apparently no specialty surface for the wheelchair either. The patient has Medicaid and he might be eligible for 1 of these 2 helpful surfaces as long as he has this wound 4/14. Patient with a wound on the left buttock. We have been using Santyl. They have been changing this multiple times a day because of urinary incontinence. They are requesting additional Santyl ALSO the patient has a new wound on the tip of his left great toe 4/28; wound on the left buttock actually looks quite good. We have been using Santyl to this area. He does have a divot in the middle of this but this does not probe to bone. The area that was new last week on the tip of his left great toe has a necrotic surface. I looked over what was available in [place] link. There is nothing in reference to peripheral vascular testing or an echocardiogram. The patient has a history of mental retardation, quadriparetic cerebral palsy, asthma. He has a feeding tube in place. He has had surgery on his lumbar spine for spinal stabilization. The exact reason for this wound is not clear. 5/15; he continues to make nice improvements on the left buttock wound. We have been using Santyl. Patient had a new wound on the tip of his left great toe about a month ago. The cause of this is not really clear but obviously could be a pressure component. I cannot feel his posterior tibial or dorsalis pedis pulses however 5/29; left buttock wound is healed today. The area on the tip of his left great  toe is also a lot better. They have been using Santyl to both wound areas.  I have changed the primary dressing to the tip of the great toe to collagen 6/15; his mother assures me that the left buttock is remained healed. The area on his tip of his left great toe is also healed. The exact cause of this was not really clear. He has very difficult to feel peripheral pulses however there is absolutely no way he can undergo any sort of formal testing. The area did closed however  Readmission:01/31/2020 upon evaluation today patient actually appears to be doing quite well with regard to the wounds which I think is mother has been taking good care of based on what I am seeing. He is here because he has 2 new wounds. After he healed last time under the care of Dr. Leanord Hawking apparently the air mattress that was obtained during that time was then taken due to the fact that he no longer had open wounds. Again he spends a statue amount of time in the bed and though his mother does try to offload and keep pressure off the area is very difficult sometimes to keep him exactly still in position where she has them. Obviously this is a risk for further breakdown which is why honestly he would really do well to have an ongoing air mattress. However insurance will not pay for that with the lack of actual ulcers once they heal. Either way I think we may need to try to find something a little bit softer for him possibly gel overlay that may be beneficial. 02/21/2020 upon evaluation today patient appears to be doing excellent in regard to his wounds. In fact the left trochanter ulcer is still open the ischial ulcer is actually better. Fortunately I feel like the Mel[place] Popper has done a great job and his family seems to be keeping a very close and good eye on the situation as far as offloading is concerned. Electronic Signature(s) Signed: 02/21/2020 12:05:37 PM By: Lenda Kelp PA-C Entered By: Lenda Kelp on 02/21/2020  12:05:36 -------------------------------------------------------------------------------- Physical Exam Details EMAD, BRECHTEL 02/21/2020 11:00 Patient Name: Date of Service: M. AM Medical Record Patient Account Number: [id number] Number: Treating RN: Zenaida Deed Date of Birth/Sex: 01-15-2001 (18 y.o. M) Other Clinician: Primary Care Provider: FLEMING, CHARLENE Treating Stone III, Emi Belfast Referring Provider: Provider/Extender: FLEMING, CHARLENE Weeks in Treatment: 3 Constitutional Thin and well-hydrated in no acute distress. Respiratory normal breathing without difficulty. Psychiatric Patient is not able to cooperate in decision making regarding care. Patient has dementia. patient is confused. Notes Inspection patient's wound bed actually showed signs of good granulation at this time. Fortunately there is no evidence of active infection which is great news. No fevers, chills, nausea, vomiting, or diarrhea. No sharp debridement was necessary today. Electronic Signature(s) Signed: 02/21/2020 12:06:12 PM By: Lenda Kelp PA-C Entered By: Lenda Kelp on 02/21/2020 12:06:11 -------------------------------------------------------------------------------- Physician Orders Details EVER, GUSTAFSON 02/21/2020 11:00 Patient Name: Date of Service: M. AM Medical Record Patient Account Number: [id number] Number: Treating RN: Zenaida Deed Date of Birth/Sex: December 29, 2000 (18 y.o. 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 Dressing Change Frequency Wound #4 Left Trochanter Change dressing every day. Wound Cleansing Wound #4 Left Trochanter Clean wound with Wound Cleanser Primary Wound Dressing Wound #4 Left Trochanter Santyl Ointment Secondary Dressing Wound #4 Left Trochanter Foam Border - or large bandaid Off-Loading Gel mattress overlay (Group 1) Turn and reposition every 2 hours Electronic Signature(s) Signed: 02/21/2020 6:50:57 PM By: Zenaida Deed RN, BSN Signed: 02/21/2020 10:08:08 PM By: Lenda Kelp PA-C Entered By: Zenaida Deed on 02/21/2020 12:05:05 -------------------------------------------------------------------------------- Problem List Details KREE, RAFTER 02/21/2020 11:00 Patient Name: Date of Service: M. AM Medical Record Patient Account Number: [id number] Number: Treating RN: Zenaida Deed Date of Birth/Sex: September 19, 2001 (18 y.o. 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 AM Medical Record Patient Account Number: [id number] Number: Treating RN: Zenaida Deed Date of Birth/Sex: [birth year] (18 y.o. M) Other  Clinician: Primary Care Provider: FLEMING, CHARLENE Treating Lenda Kelp Referring Provider: Provider/Extender: FLEMING, CHARLENE Weeks in Treatment: 3 Subjective Chief Complaint Information obtained from Patient Left Ilium and Left Trochanter Ulcers History of Present Illness (HPI) ADMISSION 02/21/2019 This is an 19 year old man who arrives in clinic accompanied by his mother. 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 He is totally dependent in all aspects of daily living and he has a PEG tube for nutrition. He apparently developed a pressure sore on the left buttock sometime in February. His mother started treating it with hydrogen peroxide and triple antibiotic therapy and offloading this as best as she could manage. He was seen by his pediatrician yesterday and referred to the emergency room at [facility]. Lab work in the hospital showed a white count of 6 a hemoglobin of 13.1 his basic metabolic panel was normal. An appointment was made for him to be seen in our clinic today. He was prescribed Santyl. He has no prior wound history. 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 Prior back surgery Social; he has a hospital bed but he does not have an offloading surface. He has a wheelchair but apparently no specialty surface for the wheelchair either. The patient has Medicaid and he might be eligible for 1 of these 2 helpful surfaces as long as he has this wound 4/14. Patient with a wound on the left buttock. We have been using Santyl. They have been changing this multiple times a day because of urinary incontinence. They are requesting additional Trudie Buckler the patient has a new wound on the tip of his left great toe 4/28; wound on the left buttock actually looks quite good. We have been using Santyl to this area. He does have a divot  in the middle of this but this does not probe to bone. ooThe area that was new last week on the tip of his left great toe has a necrotic surface. I looked over what was available in [facility] health  link. There is nothing in reference to peripheral vascular testing or an echocardiogram. The patient has a history of mental retardation, quadriparetic cerebral palsy, asthma. He has a feeding tube in place. He has had surgery on his lumbar spine for spinal stabilization. The exact reason for this wound is not clear. 5/15; he continues to make nice improvements on the left buttock wound. We have been using Santyl. Patient had a new wound on the tip of his left great toe about a month ago. The cause of this is not really clear but obviously could be a pressure component. I cannot feel his posterior tibial or dorsalis pedis pulses however 5/29; left buttock wound is healed today. The area on the tip of his left great toe is also a lot better. They have been using Santyl to both wound areas. I have changed the primary dressing to the tip of the great toe to collagen 6/15; his mother assures me that the left buttock is remained healed. The area on his tip of his left great toe is also healed. The exact cause of this was not really clear. He has very difficult to feel peripheral pulses however there is absolutely no way he can undergo any sort of formal testing. The area did closed however  Readmission:01/31/2020 upon evaluation today patient actually appears to be doing quite well with regard to the wounds which I think is mother has been taking good care of based on what I am seeing. He is here because he has 2 new wounds. After he healed last time under the care of Dr. Leanord Hawkingobson apparently the air mattress that was obtained during that time was then taken due to the fact that he no longer had open wounds. Again he spends a statue amount of time in the bed and though his mother does try to offload and keep  pressure off the area is very difficult sometimes to keep him exactly still in position where she has them. Obviously this is a risk for further breakdown which is why honestly he would really do well to have an ongoing air mattress. However insurance will not pay for that with the lack of actual ulcers once they heal. Either way I think we may need to try to find something a little bit softer for him possibly gel overlay that may be beneficial. 02/21/2020 upon evaluation today patient appears to be doing excellent in regard to his wounds. In fact the left trochanter ulcer is still open the ischial ulcer is actually better. Fortunately I feel like the Melburn PopperSantyl has done a great job and his family seems to be keeping a very close and good eye on the situation as far as offloading is concerned. Objective Constitutional Thin and well-hydrated in no acute distress. Vitals Time Taken: 11:21 AM, Weight: 51.5 lbs, Temperature: 97.4 F, Pulse: 79 bpm, Respiratory Rate: 16 breaths/min, Blood Pressure: 95/60 mmHg. Respiratory normal breathing without difficulty. Psychiatric Patient is not able to cooperate in decision making regarding care. Patient has dementia. patient is confused. General Notes: Inspection patient's wound bed actually showed signs of good granulation at this time. Fortunately there is no evidence of active infection which is great news. No fevers, chills, nausea, vomiting, or diarrhea. No sharp debridement was necessary today. Integumentary (Hair, Skin) Wound #3 status is Open. Original cause of wound was Pressure Injury. The wound is located on the Left Ilium. The wound measures 0cm length x 0cm width x 0cm depth; 0cm^2 area and 0cm^3 volume. Wound #  4 status is Open. Original cause of wound was Pressure Injury. The wound is located on the Left Trochanter. The wound measures 0.8cm length x 1cm width x 0.1cm depth; 0.628cm^2 area and 0.063cm^3 volume. There is Fat Layer (Subcutaneous  Tissue) Exposed exposed. There is no tunneling or undermining noted. There is a medium amount of serosanguineous drainage noted. The wound margin is flat and intact. There is medium (34-66%) pink granulation within the wound bed. There is a medium (34-66%) amount of necrotic tissue within the wound bed including Adherent Slough. 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 Dressing Change Frequency: Wound #4 Left Trochanter: Change dressing every day. Wound Cleansing: Wound #4 Left Trochanter: Clean wound with Wound Cleanser Primary Wound Dressing: Wound #4 Left Trochanter: Santyl Ointment Secondary Dressing: Wound #4 Left Trochanter: Foam Border - or large bandaid Off-Loading: Gel mattress overlay (Group 1) Turn and reposition every 2 hours 1. I would recommend currently that we continue with the Santyl which I feel like is doing a good job for the patient and the family is in agreement with plan. 2. Also recommend continued ongoing offloading as well which I think they have been doing very effectively obviously that this needs to continue. 3. With regard to the gel overlay for his mattress I do think that still something that would be beneficial unfortunately they have not gotten that yet on get a recommend that we go ahead and follow-up on that order and see where things stand. Obviously I know sometimes these things can take time. We will see patient back for reevaluation in 1 week here in the clinic. If anything worsens or changes patient will contact our office for additional recommendations. Electronic Signature(s) Signed: 02/21/2020 12:06:54 PM By: Lenda Kelp PA-C Entered By: Lenda Kelp on 02/21/2020  12:06:53 -------------------------------------------------------------------------------- SuperBill Details Patient Name: Date of Service: SHARRIEFF, SPRATLIN 02/21/2020 Medical Record [id number] Patient Account Number: [id number] Date of Birth/Sex: Treating RN: 08/16/2001 (19 y.o. 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

## 2020-02-28 ENCOUNTER — Other Ambulatory Visit: Payer: Self-pay | Admitting: Pediatrics

## 2020-02-28 DIAGNOSIS — G8250 Quadriplegia, unspecified: Secondary | ICD-10-CM

## 2020-02-28 MED ORDER — TRANSFER BENCH MISC: 1 refills | Status: AC

## 2020-03-06 ENCOUNTER — Encounter (HOSPITAL_BASED_OUTPATIENT_CLINIC_OR_DEPARTMENT_OTHER): Payer: Medicaid Other | Attending: Physician Assistant | Admitting: Physician Assistant

## 2020-03-06 NOTE — Progress Notes (Signed)
PAT, SIRES ([id number]) Visit Report for 02/21/2020 Arrival Information Details Jeffery [name] 02/21/2020 11:00 Patient Name: Date of Service: Jeffery [name] Medical Record Patient Account Number: [id number] Number: Treating RN: Baruch Gouty Date of Birth/Sex: 22-Dec-2000 (18 y.o. M) Other Clinician: Primary Care Haleigh Desmith: FLEMING, CHARLENE Treating Worthy Keeler Referring Larri Yehle: [name]: FLEMING, CHARLENE Weeks in Treatment: 3 Visit Information History Since Last Visit Added or deleted any medications: No Patient Arrived: Wheel Chair Any new allergies or adverse reactions: No Arrival Time: 11:13 Had a fall or experienced change in No Accompanied By: mother activities of daily living that may affect Transfer Assistance: None risk of falls: Patient Identification Verified: Yes Signs or symptoms of abuse/neglect No Secondary Verification Process Yes since last visito Completed: Hospitalized since last visit: No Patient Requires Transmission-Based No Implantable device outside of the clinic No Precautions: excluding Patient Has Alerts: Yes cellular tissue based products placed in Patient Alerts: Translator the center Required since last visit: Has Dressing in Place as Prescribed: Yes Pain Present Now: Unable to Respond Electronic Signature(s) Signed: 03/06/2020 9:21:08 [name] By: Sandre Kitty Entered By: Sandre Kitty on 02/21/2020 11:21:46 -------------------------------------------------------------------------------- Clinic Level of Care Assessment Details Jeffery, [name] 02/21/2020 11:00 Patient Name: Date of Service: Jeffery [name] Medical Record Patient Account Number: [id number] Number: Treating RN: Baruch Gouty Date of Birth/Sex: 09/19/2001 (18 y.o. M) Other Clinician: Primary Care Anyelina Claycomb: FLEMING, CHARLENE Treating Worthy Keeler Referring Yisel Megill: Trinady Milewski/Extender: FLEMING, CHARLENE Weeks in  Treatment: 3 Clinic Level of Care Assessment Items TOOL 4 Quantity Score []  - Use when only an EandM is performed on FOLLOW-UP visit 0 ASSESSMENTS - Nursing Assessment / Reassessment X - Reassessment of Co-morbidities (includes updates in patient status) 1 10 X - Reassessment of Adherence to Treatment Plan 1 5 ASSESSMENTS - Wound and Skin Assessment / Reassessment []  - Simple Wound Assessment / Reassessment - one wound 0 X - Complex Wound Assessment / Reassessment - multiple wounds 2 5 []  - Dermatologic / Skin Assessment (not related to wound area) 0 ASSESSMENTS - Focused Assessment []  - Circumferential Edema Measurements - multi extremities 0 []  - Nutritional Assessment / Counseling / Intervention 0 []  - Lower Extremity Assessment (monofilament, tuning fork, pulses) 0 []  - Peripheral Arterial Disease Assessment (using hand held doppler) 0 ASSESSMENTS - Ostomy and/or Continence Assessment and Care []  - Incontinence Assessment and Management 0 []  - Ostomy Care Assessment and Management (repouching, etc.) 0 PROCESS - Coordination of Care X - Simple Patient / Family Education for ongoing care 1 15 []  - Complex (extensive) Patient / Family Education for ongoing care 0 X - Staff obtains Programmer, systems, Records, Test Results / Process Orders 1 10 []  - Staff telephones HHA, Nursing Homes / Clarify orders / etc 0 []  - Routine Transfer to another Facility (non-emergent condition) 0 []  - Routine Hospital Admission (non-emergent condition) 0 []  - New Admissions / Biomedical engineer / Ordering NPWT, Apligraf, etc. 0 []  - Emergency Hospital Admission (emergent condition) 0 X - Simple Discharge Coordination 1 10 []  - Complex (extensive) Discharge Coordination 0 PROCESS - Special Needs []  - Pediatric / Minor Patient Management 0 []  - Isolation Patient Management 0 []  - Hearing / Language / Visual special needs 0 []  - Assessment of Community assistance (transportation, D/C planning, etc.) 0 []  -  Additional assistance / Altered mentation 0 []  - Support Surface(s) Assessment (bed, cushion, seat, etc.) 0 INTERVENTIONS - Wound Cleansing / Measurement []  - Simple Wound Cleansing - one wound 0 X - Complex Wound  Cleansing - multiple wounds 2 5 X - Wound Imaging (photographs - any number of wounds) 1 5 []  - Wound Tracing (instead of photographs) 0 []  - Simple Wound Measurement - one wound 0 X - Complex Wound Measurement - multiple wounds 2 5 INTERVENTIONS - Wound Dressings X - Small Wound Dressing one or multiple wounds 1 10 []  - Medium Wound Dressing one or multiple wounds 0 []  - Large Wound Dressing one or multiple wounds 0 X - Application of Medications - topical 1 5 []  - Application of Medications - injection 0 INTERVENTIONS - Miscellaneous []  - External ear exam 0 []  - Specimen Collection (cultures, biopsies, blood, body fluids, etc.) 0 []  - Specimen(s) / Culture(s) sent or taken to Lab for analysis 0 []  - Patient Transfer (multiple staff / / Similar devices) 0 []  - Simple Staple / Suture removal (25 or less) 0 []  - Complex Staple / Suture removal (26 or more) 0 []  - Hypo / Hyperglycemic Management (close monitor of Blood Glucose) 0 []  - Ankle / Brachial Index (ABI) - do not check if billed separately 0 X - Vital Signs 1 5 Has the patient been seen at the hospital within the last three years: Yes Total Score: 105 Level Of Care: New/Established - Level 3 Electronic Signature(s) Signed: 02/21/2020 6:50:57 PM By: RN, BSN Entered By: on 02/21/2020 12:04:24 -------------------------------------------------------------------------------- Encounter Discharge Information Details Jeffery, [name] 02/21/2020 11:00 Patient Name: Date of Service: Jeffery [name] Medical Record Patient Account Number:  Number: Treating RN: Nurse, adult Date of Birth/Sex: 04-18-2001 (18 y.o. M) Other Clinician: Primary Care [name]: FLEMING,  CHARLENE Treating Referring Elle Vezina: [name]/Extender: FLEMING, CHARLENE Weeks in Treatment: 3 Encounter Discharge Information Items Discharge Condition: Stable Ambulatory Status: Wheelchair Discharge Destination: Home Transportation: Private Auto family members and Accompanied By: interpreter Schedule Follow-up Yes Appointment: Clinical Summary of Care: Electronic Signature(s) Signed: 02/21/2020 5:08:19 PM By: 02/23/2020 Entered By: Zenaida Deed on 02/21/2020 12:18:34 -------------------------------------------------------------------------------- Lower Extremity Assessment Details Jeffery, [name] 02/21/2020 11:00 Patient Name: Date of Service: Jeffery [name] Medical Record Patient Account Number: 02/23/2020 [id number] Number: Treating RN: [id number] Date of Birth/Sex: 04-Dec-2000 (18 y.o. M) Other Clinician: Primary Care Marycruz Boehner: FLEMING, CHARLENE Treating 10-19-2004 Referring Chantrell Apsey: Kishia Shackett/Extender: FLEMING, CHARLENE Weeks in Treatment: 3 Electronic Signature(s) Signed: 02/21/2020 5:08:19 PM By: 02/23/2020 Signed: 02/21/2020 6:50:57 PM By: Shawn Stall RN, BSN Entered By: 02/23/2020 on 02/21/2020 11:34:30 -------------------------------------------------------------------------------- Multi-Disciplinary Care Plan Details Jeffery, [name] 02/21/2020 11:00 Patient Name: Date of Service: Jeffery [name] Medical Record Patient Account Number: [id number] Zenaida Deed Number: Treating RN: 06/21/2001 Date of Birth/Sex: 2001/11/07 (18 y.o. M) Other Clinician: Primary Care Yanky Vanderburg: FLEMING, CHARLENE Treating 02/23/2020 Referring Jael Waldorf: Thanos Cousineau/Extender: FLEMING, CHARLENE Weeks in Treatment: 3 Active Inactive Nutrition Nursing Diagnoses: Potential for alteratiion in Nutrition/Potential for imbalanced nutrition Goals: Patient/caregiver agrees to and verbalizes understanding of need to use nutritional supplements and/or  vitamins as prescribed Date Initiated: 01/31/2020 Target Resolution Date: 02/28/2020 Goal Status: Active Interventions: Assess patient nutrition upon admission and as needed per policy Treatment Activities: Patient referred to Primary Care Physician for further nutritional evaluation : 01/31/2020 Notes: Pressure Nursing Diagnoses: Knowledge deficit related to management of pressures ulcers Potential for impaired tissue integrity related to pressure, friction, moisture, and shear Goals: Patient/caregiver will verbalize understanding of pressure ulcer management Date Initiated: 01/31/2020 Target Resolution Date: 02/28/2020 Goal Status: Active Interventions: Assess: immobility, friction, shearing, incontinence upon admission and as needed Assess offloading mechanisms  upon admission and as needed Notes: Wound/Skin Impairment Nursing Diagnoses: Impaired tissue integrity Knowledge deficit related to ulceration/compromised skin integrity Goals: Patient/caregiver will verbalize understanding of skin care regimen Date Initiated: 01/31/2020 Target Resolution Date: 02/28/2020 Goal Status: Active Ulcer/skin breakdown will have a volume reduction of 30% by week 4 Date Initiated: 01/31/2020 Target Resolution Date: 02/28/2020 Goal Status: Active Interventions: Assess patient/caregiver ability to obtain necessary supplies Assess patient/caregiver ability to perform ulcer/skin care regimen upon admission and as needed Assess ulceration(s) every visit Provide education on ulcer and skin care Treatment Activities: Skin care regimen initiated : 01/31/2020 Topical wound management initiated : 01/31/2020 Notes: Electronic Signature(s) Signed: 02/21/2020 6:50:57 PM By: Zenaida Deed RN, BSN Entered By: Zenaida Deed on 02/21/2020 12:02:33 -------------------------------------------------------------------------------- Pain Assessment Details Jeffery, [name] 02/21/2020 11:00 Patient Name: Date  of Service: Jeffery [name] Medical Record Patient Account Number: [id number] Number: Treating RN: Zenaida Deed Date of Birth/Sex: 2001-10-02 (18 y.o. M) Other Clinician: Primary Care Tonnette Zwiebel: FLEMING, CHARLENE Treating Lenda Kelp Referring Allyah Heather: Michaeal Davis/Extender: FLEMING, CHARLENE Weeks in Treatment: 3 Active Problems Location of Pain Severity and Description of Pain Patient Has Paino Patient Unable to Respond Site Locations Pain Management and Medication Current Pain Management: Electronic Signature(s) Signed: 02/21/2020 6:50:57 PM By: Zenaida Deed RN, BSN Signed: 03/06/2020 9:21:08 [name] By: Karl Ito Entered By: Karl Ito on 02/21/2020 11:21:38 -------------------------------------------------------------------------------- Patient/Caregiver Education Details Jeffery [name] 3/31/2021andnbsp11:00 Patient Name: Date of Service: Jeffery [name] Medical Record Patient Account Number: [id number] Number: Treating RN: Zenaida Deed Date of Birth/Gender: 01-30-2001 (19 y.o. M) Other Clinician: Primary Care Treating FLEMING, Lance Sell Physician: Physician/Extender: Referring Physician: Watt Climes in Treatment: 3 Education Assessment Education Provided To: Patient Education Topics Provided Pressure: Methods: Explain/Verbal Responses: Reinforcements needed, State content correctly Wound/Skin Impairment: Methods: Explain/Verbal Responses: Reinforcements needed, State content correctly Electronic Signature(s) Signed: 02/21/2020 6:50:57 PM By: Zenaida Deed RN, BSN Entered By: Zenaida Deed on 02/21/2020 12:02:56 -------------------------------------------------------------------------------- Wound Assessment Details Jeffery, [name] 02/21/2020 11:00 Patient Name: Date of Service: Jeffery [name] Medical Record Patient Account Number: [id number] Number: Treating RN: Zenaida Deed Date of  Birth/Sex: Jan 11, 2001 (18 y.o. M) Other Clinician: Primary Care Farah Benish: FLEMING, CHARLENE Treating Lenda Kelp Referring Danashia Landers: Alexanderjames Berg/Extender: FLEMING, CHARLENE Weeks in Treatment: 3 Wound Status Wound Number: 3 Primary Etiology: Pressure Ulcer Wound Location: Left Ilium Wound Status: Healed - Epithelialized Wounding Event: Pressure Injury Comorbid History: Asthma, Quadriplegia Date Acquired: 01/22/2020 Weeks Of Treatment: 3 Clustered Wound: No Photos Wound Measurements Length: (cm) 0 % Reduction Width: (cm) 0 % Reduction Depth: (cm) 0 Epithelializ Area: (cm) 0 Volume: (cm) 0 Wound Description Classification: Unstageable/Unclassified Foul Odor Wound Margin: Flat and Intact Slough/Fib Exudate Amount: Small Exudate Type: Serosanguineous Exudate Color: red, brown Wound Bed Granulation Amount: None Present (0%) Necrotic Amount: Large (67-100%) Fascia Expo Necrotic Quality: Adherent Slough Fat Layer ( Tendon Expo Muscle Expo Joint Expos Bone Expos After Cleansing: No rino No Exposed Structure sed: No Subcutaneous Tissue) Exposed: No sed: No sed: No ed: No ed: No in Area: 100% in Volume: 100% ation: None Electronic Signature(s) Signed: 02/26/2020 3:38:17 PM By: Benjaman Kindler EMT/HBOT Signed: 02/27/2020 6:21:28 PM By: Zenaida Deed RN, BSN Previous Signature: 02/21/2020 6:50:57 PM Version By: Zenaida Deed RN, BSN Entered By: Benjaman Kindler on 02/23/2020 11:53:00 -------------------------------------------------------------------------------- Wound Assessment Details Jeffery, [name] 02/21/2020 11:00 Patient Name: Date of Service: Jeffery [name] Medical Record Patient Account Number: [id number] Number: Treating RN: Zenaida Deed Date of Birth/Sex: [birth year] (18 y.o. M) Other Clinician: Primary Care Tabius Rood:  FLEMING, CHARLENE Treating Lenda Kelp Referring Zolton Dowson: Yuriy Cui/Extender: FLEMING, CHARLENE Weeks in Treatment: 3 Wound  Status Wound Number: 4 Primary Etiology: Pressure Ulcer Wound Location: Left Trochanter Wound Status: Open Wounding Event: Pressure Injury Comorbid History: Asthma, Quadriplegia Date Acquired: 01/22/2020 Weeks Of Treatment: 3 Clustered Wound: No Photos Wound Measurements Length: (cm) 0.8 % Reduction Width: (cm) 1 % Reduction Depth: (cm) 0.1 Epitheliali Area: (cm) 0.628 Tunneling: Volume: (cm) 0.063 Underminin Wound Description Classification: Category/Stage III Wound Margin: Flat and Intact Exudate Amount: Medium Exudate Type: Serosanguineous Exudate Color: red, brown Wound Bed Granulation Amount: Medium (34-66%) Granulation Quality: Pink Necrotic Amount: Medium (34-66%) Necrotic Quality: Adherent Slough Foul Odor After Cleansing: No Slough/Fibrino Yes Exposed Structure Fascia Exposed: No Fat Layer (Subcutaneous Tissue) Exposed: Yes Tendon Exposed: No Muscle Exposed: No Joint Exposed: No Bone Exposed: No in Area: 48.1% in Volume: 74% zation: Medium (34-66%) No g: No Treatment Notes Wound #4 (Left Trochanter) 1. Cleanse With Wound Cleanser 2. Periwound Care Skin Prep 3. Primary Dressing Applied Santyl 4. Secondary Dressing Foam Border Dressing 5. Secured With Advice worker) Signed: 02/26/2020 3:38:17 PM By: Benjaman Kindler EMT/HBOT Signed: 02/27/2020 6:21:28 PM By: Zenaida Deed RN, BSN Previous Signature: 02/21/2020 5:08:19 PM Version By: Shawn Stall Entered By: Benjaman Kindler on 02/23/2020 11:53:17 -------------------------------------------------------------------------------- Vitals Details Jeffery [name], Jeffery [name] 02/21/2020 11:00 Patient Name: Date of Service: Jeffery [name] Medical Record Patient Account Number: [id number] Number: Treating RN: Zenaida Deed Date of Birth/Sex: [birth year] (18 y.o. M) Other Clinician: Primary Care Sevin Langenbach: FLEMING, CHARLENE Treating Lenda Kelp Referring  Wilder Kurowski: Nerea Bordenave/Extender: FLEMING, CHARLENE Weeks in Treatment: 3 Vital Signs Time Taken: 11:21 Temperature (F): 97.4 Weight (lbs): 51.5 Pulse (bpm): 79 Respiratory Rate (breaths/min): 16 Blood Pressure (mmHg): 95/60 Reference Range: 80 - 120 mg / dl Electronic Signature(s) Signed: 03/06/2020 9:21:08 [name] By: Karl Ito Entered By: Karl Ito on 02/21/2020 11:21:31

## 2020-03-21 ENCOUNTER — Telehealth: Payer: Self-pay | Admitting: Pediatrics
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
## 2020-03-21 NOTE — Telephone Encounter (Signed)
Received call From [facility] Dietitian-stated pt missed his appt yesterday with her and the GI DR-Dr Alphonzo Grieve. She stated by her records he has lost over 20lbs since Sept. 2018--stated he is still on a G-tube so he weight shouldn't be dropping that drastic. Stated he weighted 78 lbs then and last apt his weight was 56lbs
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
## 2020-03-27 ENCOUNTER — Telehealth: Payer: Self-pay | Admitting: Pediatrics

## 2020-03-27 ENCOUNTER — Telehealth: Payer: Self-pay

## 2020-03-27 NOTE — Telephone Encounter (Signed)
Can you help me with this, can you call the person back from [facility]: Lambert Rhonda W and find out who exactly the person is and what they are calling about regarding Javante?  Thank you

## 2020-03-27 NOTE — Telephone Encounter (Signed)
Please call family and schedule an appointment for Jeffery [name] to be seen for a weight check (45 minutes) on Thursday or Friday, since he missed his appointments with GI and Nutrition at [facility].   Thank you
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
## 2020-03-27 NOTE — Telephone Encounter (Signed)
She just wanted you to know, should you need to admit Jeffery [name] for any reason to contact her and she will arrange for him to be in the pediatric unit due to his situation.

## 2020-03-27 NOTE — Telephone Encounter (Signed)
She is with the [facility].  I left her a VM to call me back.
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
## 2020-03-27 NOTE — Telephone Encounter (Signed)
[facility] called wanted to let Dr. [name] know that they can help her with admitting  Jeffery [name] to a peds. Instead of him going to an adult. [facility] received a phone call from the diet. and said his weight was 56lb. And had some concern and wanted to make sure dr. Verdie Shire know about the situation.

## 2020-03-28 ENCOUNTER — Telehealth: Payer: Self-pay | Admitting: Pediatrics
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
## 2020-03-29 ENCOUNTER — Encounter (HOSPITAL_BASED_OUTPATIENT_CLINIC_OR_DEPARTMENT_OTHER): Payer: Medicaid Other | Attending: Internal Medicine | Admitting: Internal Medicine

## 2020-03-29 DIAGNOSIS — G800 Spastic quadriplegic cerebral palsy: Secondary | ICD-10-CM | POA: Insufficient documentation

## 2020-03-29 DIAGNOSIS — L89890 Pressure ulcer of other site, unstageable: Secondary | ICD-10-CM | POA: Diagnosis not present

## 2020-03-29 DIAGNOSIS — J45909 Unspecified asthma, uncomplicated: Secondary | ICD-10-CM | POA: Insufficient documentation

## 2020-03-29 DIAGNOSIS — F73 Profound intellectual disabilities: Secondary | ICD-10-CM | POA: Insufficient documentation

## 2020-03-29 DIAGNOSIS — L89223 Pressure ulcer of left hip, stage 3: Secondary | ICD-10-CM | POA: Insufficient documentation

## 2020-03-29 DIAGNOSIS — Z931 Gastrostomy status: Secondary | ICD-10-CM | POA: Diagnosis not present

## 2020-03-29 NOTE — Progress Notes (Signed)
Jeffery [name], Jeffery [name] ([id number]) Visit Report for 03/29/2020 HPI Details Patient Name: Date of Service: TO RRESSA Jeffery [name]. 03/29/2020 1:30 PM Medical Record Number: [id number] Patient Account Number: [id number] Date of Birth/Sex: Treating RN: January 02, 2001 (19 y.o. M) Primary Care Provider: [name], CHA RLENE Other Clinician: Referring Provider: Treating Provider/Extender: Davene Costain, CHA RLENE Weeks in Treatment: 8 History of Present Illness HPI Description: ADMISSION 02/21/2019 This is an 19 year old man who arrives in clinic accompanied by his mother. 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 He is totally dependent in all aspects of daily living and he has a PEG tube for nutrition. He apparently developed a pressure sore on the left buttock sometime in February. His mother started treating it with hydrogen peroxide and triple antibiotic therapy and offloading this as best as she could manage. He was seen by his pediatrician yesterday and referred to the emergency room at [facility] [place]. Lab work in the hospital showed a white count of 6 a hemoglobin of 13.1 his basic metabolic panel was normal. An appointment was made for him to be seen in our clinic today. He was prescribed Santyl. He has no prior wound history. 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 Prior back surgery Social; he has a hospital bed but he does not have an offloading surface. He has a wheelchair but apparently no specialty surface for the wheelchair either. The patient has Medicaid and he might be eligible for 1 of these 2 helpful surfaces as long as he has this wound 4/14. Patient with a wound on the left buttock. We have been using Santyl. They have been changing this multiple times a day because of urinary incontinence. They are requesting additional  Santyl ALSO the patient has a new wound on the tip of his left great toe 4/28; wound on the left buttock actually looks quite good. We have been using Santyl to this area. He does have a divot in the middle of this but this does not probe to bone. The area that was new last week on the tip of his left great toe has a necrotic surface. I looked over what was available in [place] link. There is nothing in reference to peripheral vascular testing or an echocardiogram. The patient has a history of mental retardation, quadriparetic cerebral palsy, asthma. He has a feeding tube in place. He has had surgery on his lumbar spine for spinal stabilization. The exact reason for this wound is not clear. 5/15; he continues to make nice improvements on the left buttock wound. We have been using Santyl. Patient had a new wound on the tip of his left great toe about a month ago. The cause of this is not really clear but obviously could be a pressure component. I cannot feel his posterior tibial or dorsalis pedis pulses however 5/29; left buttock wound is healed today. The area on the tip of his left great toe is also a lot better. They have been using Santyl to both wound areas. I have changed the primary dressing to the tip of the great toe to collagen 6/15; his mother assures me that the left buttock is remained healed. The area on his tip of his left great toe is also healed. The exact cause of this was not really clear. He has very difficult to feel peripheral pulses however there is absolutely no way he can undergo any sort of  formal testing. The area did closed however  Readmission:01/31/2020 upon evaluation today patient actually appears to be doing quite well with regard to the wounds which I think is mother has been taking good care of based on what I am seeing. He is here because he has 2 new wounds. After he healed last time under the care of Dr. Leanord Hawkingobson apparently the air mattress that was obtained  during that time was then taken due to the fact that he no longer had open wounds. Again he spends a statue amount of time in the bed and though his mother does try to offload and keep pressure off the area is very difficult sometimes to keep him exactly still in position where she has them. Obviously this is a risk for further breakdown which is why honestly he would really do well to have an ongoing air mattress. However insurance will not pay for that with the lack of actual ulcers once they heal. Either way I think we may need to try to find something a little bit softer for him possibly gel overlay that may be beneficial. 02/21/2020 upon evaluation today patient appears to be doing excellent in regard to his wounds. In fact the left trochanter ulcer is still open the ischial ulcer is actually better. Fortunately I feel like the Melburn PopperSantyl has done a great job and his family seems to be keeping a very close and good eye on the situation as far as offloading is concerned. 03/29/2020; this is a patient who had not been here in a month and a half. He had pressure ulcers on the left greater trochanter I think he was using Santyl. He is cared for at home by his family partially his mother aerobically. They have a hospital bed in a level 3 pressure relief surface. His wound is healed Electronic Signature(s) Signed: 03/29/2020 4:59:38 PM By: Baltazar Najjarobson, Kerby Hockley MD Entered By: Baltazar Najjarobson, Janelly Switalski on 03/29/2020 14:26:33 -------------------------------------------------------------------------------- Physical Exam Details Patient Name: Date of Service: TO Jeffery BeardsRESSA NCHEZ, A LBERT M. 03/29/2020 1:30 PM Medical Record Number: [id number] Patient Account Number: [id number] Date of Birth/Sex: Treating RN: 26-Mar-2001 (18 y.o. M) Primary Care Provider: Meredeth IdeFLEMING, CHA RLENE Other Clinician: Referring Provider: Treating Provider/Extender: Davene Costainobson, Nickoles Gregori FLEMING, CHA RLENE Weeks in Treatment: 8 Constitutional Sitting or standing  Blood Pressure is within target range for patient.. Pulse regular and within target range for patient.Marland Kitchen. Respirations regular, non-labored and within target range.. Temperature is normal and within the target range for the patient.Marland Kitchen. Appears in no distress. Notes Wound exam; patient's wound on the left hip are totally closed. We checked the right hip as well there is nothing there either. He looks about the same. PEG tube dependent appears well-hydrated Electronic Signature(s) Signed: 03/29/2020 4:59:38 PM By: Baltazar Najjarobson, Kathaleen Dudziak MD Entered By: Baltazar Najjarobson, Christina Gintz on 03/29/2020 14:27:11 -------------------------------------------------------------------------------- Physician Orders Details Patient Name: Date of Service: TO Jeffery BeardsRESSA NCHEZ, A LBERT M. 03/29/2020 1:30 PM Medical Record Number: [id number] Patient Account Number: [id number] Date of Birth/Sex: Treating RN: 26-Mar-2001 (18 y.o. 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 03/29/2020 1:30 PM Medical Record Number: [id number] Patient Account Number: [id number] Date of Birth/Sex:  Treating RN: 2001-01-17 (19 y.o. 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 03/29/2020 1:30 PM Medical Record Number: [id number] Patient Account Number: [id number] Date of Birth/Sex: Treating RN: [birth year] (19 y.o. M) Primary Care Provider: [name], CHA RLENE Other Clinician: Referring Provider: Treating Provider/Extender: Tomasa Blase, CHA RLENE Weeks in Treatment: 8 Subjective History of Present Illness (HPI) ADMISSION 02/21/2019 This is an 19 year old man who arrives in clinic accompanied by his mother. 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 He is totally dependent in all aspects of daily living and he has a PEG tube for nutrition. He apparently developed a pressure sore on the left buttock sometime in February. His mother started treating it with hydrogen peroxide and triple antibiotic therapy and offloading this as best as she could manage. He was seen by his pediatrician yesterday and referred to the emergency room at [facility]. Lab work in the hospital showed a white count of 6 a hemoglobin of 13.1 his basic metabolic panel was normal. An appointment  was made for him to be seen in our clinic today. He was prescribed Santyl. He has no prior wound history. 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 Prior back surgery Social; he has a hospital bed but he does not have an offloading surface. He has a wheelchair but apparently no specialty surface for the wheelchair either. The patient has Medicaid and he might be eligible for 1 of these 2 helpful surfaces as long as he has this wound 4/14. Patient with a wound on the left buttock. We have been using Santyl. They have been changing this multiple times a day because of urinary incontinence. They are requesting additional Ronita Hipps the patient has a new wound on the tip of his left great toe 4/28; wound on the left buttock actually looks quite good. We have been using Santyl to this area. He does have a divot in the middle of this but this does not probe to bone. ooThe area that was new last week on the tip of his left great toe has a necrotic surface. I looked over what was available in [place] link. There is nothing in reference to peripheral vascular testing or an echocardiogram. The patient has a history of mental retardation, quadriparetic cerebral palsy, asthma. He has a feeding tube in place. He has had surgery on his lumbar spine for spinal stabilization. The exact reason for this wound is not clear. 5/15; he continues to make nice improvements on the left buttock wound. We have been using Santyl. Patient had  a new wound on the tip of his left great toe about a month ago. The cause of this is not really clear but obviously could be a pressure component. I cannot feel his posterior tibial or dorsalis pedis pulses however 5/29; left buttock wound is healed today. The area on the tip of his left great toe is also a lot better. They have been using Santyl to both wound areas. I  have changed the primary dressing to the tip of the great toe to collagen 6/15; his mother assures me that the left buttock is remained healed. The area on his tip of his left great toe is also healed. The exact cause of this was not really clear. He has very difficult to feel peripheral pulses however there is absolutely no way he can undergo any sort of formal testing. The area did closed however  Readmission:01/31/2020 upon evaluation today patient actually appears to be doing quite well with regard to the wounds which I think is mother has been taking good care of based on what I am seeing. He is here because he has 2 new wounds. After he healed last time under the care of Dr. Leanord Hawking apparently the air mattress that was obtained during that time was then taken due to the fact that he no longer had open wounds. Again he spends a statue amount of time in the bed and though his mother does try to offload and keep pressure off the area is very difficult sometimes to keep him exactly still in position where she has them. Obviously this is a risk for further breakdown which is why honestly he would really do well to have an ongoing air mattress. However insurance will not pay for that with the lack of actual ulcers once they heal. Either way I think we may need to try to find something a little bit softer for him possibly gel overlay that may be beneficial. 02/21/2020 upon evaluation today patient appears to be doing excellent in regard to his wounds. In fact the left trochanter ulcer is still open the ischial ulcer is actually better. Fortunately I feel like the Melburn Popper has done a great job and his family seems to be keeping a very close and good eye on the situation as far as offloading is concerned. 03/29/2020; this is a patient who had not been here in a month and a half. He had pressure ulcers on the left greater trochanter I think he was using Santyl. He is cared for at home by his family partially  his mother aerobically. They have a hospital bed in a level 3 pressure relief surface. His wound is healed Objective Constitutional Sitting or standing Blood Pressure is within target range for patient.. Pulse regular and within target range for patient.Marland Kitchen Respirations regular, non-labored and within target range.. Temperature is normal and within the target range for the patient.Marland Kitchen Appears in no distress. Vitals Time Taken: 1:35 PM, Weight: 51.5 lbs, Pulse: 75 bpm, Respiratory Rate: 18 breaths/min, Blood Pressure: 100/44 mmHg. General Notes: Wound exam; patient's wound on the left hip are totally closed. We checked the right hip as well there is nothing there either. He looks about the same. PEG tube dependent appears well-hydrated Integumentary (Hair, Skin) Wound #4 status is Healed - Epithelialized. Original cause of wound was Pressure Injury. The wound is located on the Left Trochanter. The wound measures 0cm length x 0cm width x 0cm depth; 0cm^2 area and 0cm^3 volume. 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 The patient can be discharged from the wound care center 2. His wound on the left greater trochanter is healed Electronic Signature(s) Signed: 03/29/2020 4:59:38 PM By: Baltazar Najjar MD Entered By: Baltazar Najjar on 03/29/2020 14:27:49 -------------------------------------------------------------------------------- SuperBill Details Patient Name: Date of Service: TO Jeffery [name], Irine Seal M. 03/29/2020 Medical Record Number: [id number] Patient Account Number: [id number] Date of Birth/Sex: Treating RN: 05/25/2001 (18 y.o. 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

## 2020-03-29 NOTE — Progress Notes (Signed)
Jeffery [name], Jeffery [name] ([id number]) Visit Report for 03/29/2020 Arrival Information Details Patient Name: Date of Service: [name]. 03/29/2020 1:30 PM Medical Record Number: [id number] Patient Account Number: [id number] Date of Birth/Sex: Treating RN: [birth year] (19 y.o. M) Primary Care Christon Parada: [name], CHA RLENE Other Clinician: Referring Brittiany Wiehe: Treating Jarelly Rinck/Extender: Davene Costain, CHA RLENE Weeks in Treatment: 8 Visit Information History Since Last Visit Added or deleted any medications: No Patient Arrived: Wheel Chair Any new allergies or adverse reactions: No Arrival Time: 13:30 Had a fall or experienced change in No Accompanied By: family activities of daily living that may affect Transfer Assistance: None risk of falls: Patient Identification Verified: Yes Signs or symptoms of abuse/neglect since last visito No Secondary Verification Process Completed: Yes Hospitalized since last visit: No Patient Requires Transmission-Based Precautions: No Implantable device outside of the clinic excluding No Patient Has Alerts: Yes cellular tissue based products placed in the center Patient Alerts: Translator Required since last visit: Has Dressing in Place as Prescribed: Yes Pain Present Now: No Electronic Signature(s) Signed: 03/29/2020 4:59:21 PM By: Shawn Stall Entered By: Shawn Stall on 03/29/2020 13:36:23 -------------------------------------------------------------------------------- Clinic Level of Care Assessment Details Patient Name: Date of Service: TO Jeffery [name]. 03/29/2020 1:30 PM Medical Record Number: [id number] Patient Account Number: [id number] Date of Birth/Sex: Treating RN: 04-16-2001 (19 y.o. Jeffery [name] Primary Care Analysse Quinonez: [name], CHA RLENE Other Clinician: Referring [name]: Treating [name]/Extender: Davene Costain, CHA RLENE Weeks in Treatment: 8 Clinic Level of Care Assessment  Items TOOL 4 Quantity Score []  - 0 Use when only an EandM is performed on FOLLOW-UP visit ASSESSMENTS - Nursing Assessment / Reassessment X- 1 10 Reassessment of Co-morbidities (includes updates in patient status) X- 1 5 Reassessment of Adherence to Treatment Plan ASSESSMENTS - Wound and Skin A ssessment / Reassessment X - Simple Wound Assessment / Reassessment - one wound 1 5 []  - 0 Complex Wound Assessment / Reassessment - multiple wounds []  - 0 Dermatologic / Skin Assessment (not related to wound area) ASSESSMENTS - Focused Assessment []  - 0 Circumferential Edema Measurements - multi extremities []  - 0 Nutritional Assessment / Counseling / Intervention []  - 0 Lower Extremity Assessment (monofilament, tuning fork, pulses) []  - 0 Peripheral Arterial Disease Assessment (using hand held doppler) ASSESSMENTS - Ostomy and/or Continence Assessment and Care []  - 0 Incontinence Assessment and Management []  - 0 Ostomy Care Assessment and Management (repouching, etc.) PROCESS - Coordination of Care X - Simple Patient / Family Education for ongoing care 1 15 []  - 0 Complex (extensive) Patient / Family Education for ongoing care X- 1 10 Staff obtains , Records, T Results / Process Orders est []  - 0 Staff telephones HHA, Nursing Homes / Clarify orders / etc []  - 0 Routine Transfer to another Facility (non-emergent condition) []  - 0 Routine Hospital Admission (non-emergent condition) []  - 0 New Admissions / / Ordering NPWT Apligraf, etc. , []  - 0 Emergency Hospital Admission (emergent condition) X- 1 10 Simple Discharge Coordination []  - 0 Complex (extensive) Discharge Coordination PROCESS - Special Needs []  - 0 Pediatric / Minor Patient Management []  - 0 Isolation Patient Management []  - 0 Hearing / Language / Visual special needs []  - 0 Assessment of Community assistance (transportation, D/C planning, etc.) []  - 0 Additional  assistance / Altered mentation []  - 0 Support Surface(s) Assessment (bed, cushion, seat, etc.) INTERVENTIONS - Wound Cleansing / Measurement X - Simple Wound Cleansing - one wound 1 5 []  -  0 Complex Wound Cleansing - multiple wounds X- 1 5 Wound Imaging (photographs - any number of wounds) []  - 0 Wound Tracing (instead of photographs) []  - 0 Simple Wound Measurement - one wound []  - 0 Complex Wound Measurement - multiple wounds INTERVENTIONS - Wound Dressings []  - 0 Small Wound Dressing one or multiple wounds []  - 0 Medium Wound Dressing one or multiple wounds []  - 0 Large Wound Dressing one or multiple wounds []  - 0 Application of Medications - topical []  - 0 Application of Medications - injection INTERVENTIONS - Miscellaneous []  - 0 External ear exam []  - 0 Specimen Collection (cultures, biopsies, blood, body fluids, etc.) []  - 0 Specimen(s) / Culture(s) sent or taken to Lab for analysis []  - 0 Patient Transfer (multiple staff / Civil Service fast streamer / Similar devices) []  - 0 Simple Staple / Suture removal (25 or less) []  - 0 Complex Staple / Suture removal (26 or more) []  - 0 Hypo / Hyperglycemic Management (close monitor of Blood Glucose) []  - 0 Ankle / Brachial Index (ABI) - do not check if billed separately X- 1 5 Vital Signs Has the patient been seen at the hospital within the last three years: Yes Total Score: 70 Level Of Care: New/Established - Level 2 Electronic Signature(s) Signed: 03/29/2020 5:35:25 PM By: Baruch Gouty RN, BSN Entered By: Baruch Gouty on 03/29/2020 14:21:59 -------------------------------------------------------------------------------- Encounter Discharge Information Details Patient Name: Date of Service: TO Jeffery [name], Jeffery Guern[place] M. 03/29/2020 1:30 PM Medical Record Number: [id number] Patient Account Number: [id number] Date of Birth/Sex: Treating RN: 11-22-2001 (19 y.o. Jeffery [name] Primary Care [name]: [name], [place] Other  Clinician: Referring [name]: Treating [name]/Extender: Tomasa Blase, CHA RLENE Weeks in Treatment: 8 Encounter Discharge Information Items Discharge Condition: Stable Ambulatory Status: Wheelchair Discharge Destination: Home Transportation: Private Auto Accompanied By: mother Schedule Follow-up Appointment: Yes Clinical Summary of Care: Patient Declined Electronic Signature(s) Signed: 03/29/2020 5:35:25 PM By: Baruch Gouty RN, BSN Entered By: Baruch Gouty on 03/29/2020 14:23:51 -------------------------------------------------------------------------------- Lower Extremity Assessment Details Patient Name: Date of Service: TO Jeffery [name], Jeffery Guern[place] M. 03/29/2020 1:30 PM Medical Record Number: [id number] Patient Account Number: [id number] Date of Birth/Sex: Treating RN: 2001/04/14 (19 y.o. M) Primary Care Raynisha Avilla: [name], CHA RLENE Other Clinician: Referring Deserae Jennings: Treating Lance Galas/Extender: Tomasa Blase, CHA RLENE Weeks in Treatment: 8 Electronic Signature(s) Signed: 03/29/2020 4:59:21 PM By: Deon Pilling Entered By: Deon Pilling on 03/29/2020 13:37:22 -------------------------------------------------------------------------------- Krupp Details Patient Name: Date of Service: TO Jeffery [name], Jeffery Guern[place] M. 03/29/2020 1:30 PM Medical Record Number: [id number] Patient Account Number: [id number] Date of Birth/Sex: Treating RN: Mar 11, 2001 (19 y.o. Jeffery [name] Primary Care Myrical Andujo: [name], Cleary Other Clinician: Referring Michala Deblanc: Treating Korri Ask/Extender: Tomasa Blase, CHA RLENE Weeks in Treatment: 8 Active Inactive Electronic Signature(s) Signed: 03/29/2020 5:35:25 PM By: Baruch Gouty RN, BSN Entered By: Baruch Gouty on 03/29/2020 14:17:33 -------------------------------------------------------------------------------- Pain Assessment Details Patient Name: Date of Service: TO Jeffery [name], A  LBERT M. 03/29/2020 1:30 PM Medical Record Number: [id number] Patient Account Number: [id number] Date of Birth/Sex: Treating RN: 12-28-2000 (19 y.o. M) Primary Care Kiasha Bellin: [name], [place] Other Clinician: Referring Parley Pidcock: Treating Sitlaly Gudiel/Extender: Tomasa Blase, CHA RLENE Weeks in Treatment: 8 Active Problems Location of Pain Severity and Description of Pain Patient Has Paino No Site Locations Rate the pain. Current Pain Level: 0 Pain Management and Medication Current Pain Management: Medication: No Cold Application: No Rest: No Massage: No Activity: No T.E.N.S.: No Heat Application: No Leg drop  or elevation: No Is the Current Pain Management Adequate: Adequate How does your wound impact your activities of daily livingo Sleep: No Bathing: No Appetite: No Relationship With Others: No Bladder Continence: No Emotions: No Bowel Continence: No Work: No Toileting: No Drive: No Dressing: No Hobbies: No Electronic Signature(s) Signed: 03/29/2020 4:59:21 PM By: Shawn Stall Entered By: Shawn Stall on 03/29/2020 13:37:10 -------------------------------------------------------------------------------- Patient/Caregiver Education Details Patient Name: Date of Service: TO Jeffery [name] 5/7/2021andnbsp1:30 PM Medical Record Number: [id number] Patient Account Number: [id number] Date of Birth/Gender: Treating RN: Apr 12, 2001 (19 y.o. Jeffery [name] Primary Care Physician: Larene Beach RLENE Other Clinician: Referring Physician: Treating Physician/Extender: Davene Costain, CHA RLENE Weeks in Treatment: 8 Education Assessment Education Provided To: Patient Education Topics Provided Pressure: Methods: Explain/Verbal Responses: Reinforcements needed, State content correctly Wound/Skin Impairment: Methods: Explain/Verbal Responses: Reinforcements needed, State content correctly Electronic Signature(s) Signed: 03/29/2020 5:35:25 PM  By: Zenaida Deed RN, BSN Entered By: Zenaida Deed on 03/29/2020 14:17:55 -------------------------------------------------------------------------------- Wound Assessment Details Patient Name: Date of Service: TO [name], Irine Seal M. 03/29/2020 1:30 PM Medical Record Number: [id number] Patient Account Number: [id number] Date of Birth/Sex: Treating RN: [birth year] (18 y.o. M) Primary Care Andrya Roppolo: [name], CHA RLENE Other Clinician: Referring Yan Pankratz: Treating Kiahna Banghart/Extender: Davene Costain, CHA RLENE Weeks in Treatment: 8 Wound Status Wound Number: 4 Primary Etiology: Pressure Ulcer Wound Location: Left Trochanter Wound Status: Healed - Epithelialized Wounding Event: Pressure Injury Date Acquired: 01/22/2020 Weeks Of Treatment: 8 Clustered Wound: No Wound Measurements Length: (cm) Width: (cm) Depth: (cm) Area: (cm) Volume: (cm) 0 % Reduction in Area: 100% 0 % Reduction in Volume: 100% 0 0 0 Wound Description Classification: Category/Stage III Electronic Signature(s) Signed: 03/29/2020 4:59:21 PM By: Shawn Stall Entered By: Doyle Askew on 03/29/2020 13:38:50 -------------------------------------------------------------------------------- Vitals Details Patient Name: Date of Service: TO [name], A LBERT M. 03/29/2020 1:30 PM Medical Record Number: [id number] Patient Account Number: [id number] Date of Birth/Sex: Treating RN: 2001/05/09 (18 y.o. M) Primary Care Sherley Leser: FLEMING, CHA RLENE Other Clinician: Referring Malayzia Laforte: Treating Josette Shimabukuro/Extender: Davene Costain, CHA RLENE Weeks in Treatment: 8 Vital Signs Time Taken: 13:35 Pulse (bpm): 75 Weight (lbs): 51.5 Respiratory Rate (breaths/min): 18 Blood Pressure (mmHg): 100/44 Reference Range: 80 - 120 mg / dl Electronic Signature(s) Signed: 03/29/2020 4:59:21 PM By: Shawn Stall Entered By: Shawn Stall on 03/29/2020 13:37:02

## 2020-04-02 ENCOUNTER — Encounter: Payer: Self-pay | Admitting: Pediatrics

## 2020-04-02 ENCOUNTER — Other Ambulatory Visit: Payer: Self-pay

## 2020-04-02 ENCOUNTER — Ambulatory Visit (INDEPENDENT_AMBULATORY_CARE_PROVIDER_SITE_OTHER): Payer: Medicaid Other | Admitting: Pediatrics

## 2020-04-02 VITALS — Wt <= 1120 oz

## 2020-04-02 DIAGNOSIS — R635 Abnormal weight gain: Secondary | ICD-10-CM

## 2020-04-02 DIAGNOSIS — J4520 Mild intermittent asthma, uncomplicated: Secondary | ICD-10-CM

## 2020-04-02 DIAGNOSIS — F79 Unspecified intellectual disabilities: Secondary | ICD-10-CM

## 2020-04-02 DIAGNOSIS — G800 Spastic quadriplegic cerebral palsy: Secondary | ICD-10-CM

## 2020-04-02 DIAGNOSIS — J301 Allergic rhinitis due to pollen: Secondary | ICD-10-CM

## 2020-04-02 DIAGNOSIS — K5904 Chronic idiopathic constipation: Secondary | ICD-10-CM

## 2020-04-02 MED ORDER — POLYETHYLENE GLYCOL 3350 17 GM/SCOOP PO POWD: ORAL | 2 refills | Status: DC

## 2020-04-02 MED ORDER — CETIRIZINE HCL 5 MG/5ML PO SOLN: ORAL | 5 refills | Status: DC

## 2020-04-02 MED ORDER — MONTELUKAST SODIUM 5 MG PO CHEW: 5.0000 mg | CHEWABLE_TABLET | Freq: Every day | ORAL | 5 refills | Status: DC

## 2020-04-02 MED ORDER — ALBUTEROL SULFATE HFA 108 (90 BASE) MCG/ACT IN AERS: INHALATION_SPRAY | RESPIRATORY_TRACT | 2 refills | Status: DC

## 2020-04-02 NOTE — Patient Instructions (Signed)
Cmo evitar las lceras por presin Preventing Pressure Injuries  Una lcera por presin, a veces llamada escara, es una lesin en la piel y el tejido subyacente causada por presin. Puede producirse cuando la piel ejerce presin durante mucho tiempo contra una superficie, como un colchn o el asiento de una silla de ruedas. La presin en los vasos sanguneos disminuye la irrigacin de sangre a la piel. A la larga, esto puede hacer que el tejido de la piel [place] y se rompa, lo que causa una herida. Las lceras por presin suelen aparecer en los siguientes lugares:  Sobre las partes seas del cuerpo, como el coxis, los hombros, los codos, las caderas y los talones.  Debajo de los dispositivos mdicos, como los equipos para Ambulance person, las medias de compresin, las sondas y las frulas. Cmo puede afectarme esta enfermedad? Las lceras por presin se deben a la falta de irrigacin sangunea hacia una zona de la piel. Estas lceras comienzan como una zona enrojecida en la piel y pueden convertirse en una llaga abierta. Pueden ser el resultado de una presin intensa durante un perodo breve o de una presin menor durante un perodo prolongado. Su gravedad puede variar. Pueden causar dolor, dao muscular e infecciones. [place]? Es ms probable que Orthoptist en las personas que presentan alguna de estas caractersticas:  Estn hospitalizadas o en un centro de cuidados prolongados.  Estn postradas en cama o en una silla de ruedas.  Tienen una lesin o una enfermedad que evita lo siguiente: ? Que se muevan normalmente. ? Que sientan dolor o presin. ? Que informen si sienten dolor o presin.  Tienen una afeccin con las siguientes caractersticas: ? Provoca sueo o disminucin del estado de [place]. ? Causa menor flujo sanguneo.  Hace que deban usar un dispositivo mdico.  Tienen un control deficiente de la vejiga o los intestinos  (incontinencia).  Tienen una nutricin deficiente (desnutricin).  Tuvieron esta afeccin anteriormente.  Pertenecen a ciertos grupos tnicos. Las Engineer, manufacturing de origen afroamericano, latino o hispano corren ms riesgo en comparacin con otros grupos tnicos. Qu medidas debo tomar para evitar las lceras por presin? Reduccin y redistribucin de la presin  No permanezca sentado o recostado en una misma posicin durante largos perodos. Muvase o cambie de posicin: ? Cada hora cuando est fuera de la cama en una silla. ? Cada dos horas cuando est en la cama. ? Con la frecuencia que le haya indicado el mdico.  Utilice almohadas, almohadas triangulares o almohadones para redistribuir la presin. Pdale al mdico que le recomiende un colchn, almohadones o almohadillas.  Utilice dispositivos mdicos que no friccionen la piel. Informe al mdico si uno de los dispositivos mdicos le est causando dolor o irritacin. Cuidado de la piel Si se [facility] hospital, los mdicos harn lo siguiente:  Le revisarn la piel, incluidas las zonas debajo de dispositivos mdicos o Magazine features editor, al [employer] veces al da.  Pueden recomendarle que use determinados tipos de ropa de cama para ayudar a evitar las lceras por presin. Estos pueden incluir una almohadilla, un colchn o un almohadn para sillas que estn rellenos con gel, aire, agua o una espuma.  Evaluarn su alimentacin y consultarn a un nutricionista, si es necesario.  Revisarn y [employer] vendajes de las heridas habitualmente.  Pueden ayudarlo a cambiar de posicin cada pocas horas.  Ajustarn los dispositivos mdicos y los ortopdicos segn sea necesario para Public house manager la presin sobre la piel.  Marin Comment  mantendrn la piel limpia y [place].  Pueden usar soluciones de limpieza y protectores para la piel suaves, si tiene incontinencia.  Le hidratarn la piel seca. En general, en el hogar:  Mantenga la piel limpia y seca. Seque la  piel con palmaditas suaves.  No frote ni masajee las zonas seas de la piel.  Hidrate la piel seca.  Use soluciones de limpieza y protectores para la piel suaves, si tiene incontinencia.  Contrlese la piel al menos una vez al da para detectar cualquier cambio de color o ampollas o llagas nuevas. Revise debajo y alrededor de los dispositivos mdicos, y [employer] pliegues de la piel. Pdale a un cuidador que lo haga si no puede hacerlo usted mismo.  Estilo de vida  [employer] mayor cantidad de actividad posible Administrator. Pdale al [employer] le sugiera actividades o ejercicios que sean seguros para usted.  No consuma alcohol ni drogas en forma excesiva.  No consuma ningn producto que contenga nicotina o tabaco, como cigarrillos, cigarrillos electrnicos y tabaco de Theatre manager. Si necesita ayuda para dejar de fumar, consulte al [employer]. Instrucciones generales   [employer] de venta libre y los recetados solamente como se lo haya indicado el mdico.  Trabaje con el mdico para Pharmacologist cualquier afeccin crnica bajo control.  Siga una dieta saludable que incluya protenas, vitaminas y minerales. Pregntele al mdico qu tipo de alimentos debe consumir.  Beba suficiente lquido como para Pharmacologist la orina de color amarillo plido.  Concurra a todas las visitas de [address] se lo haya indicado el mdico. Esto es importante. Comunquese con un mdico si:  Siente u observa cualquier cambio en la piel. Resumen  Una lcera por presin, a veces llamada escara, es una lesin en la piel y el tejido subyacente causada por presin.  No permanezca sentado o recostado en una misma posicin durante largos perodos.  Contrlese la piel al menos una vez al da para detectar cualquier cambio de color o ampollas o llagas nuevas.  Revise debajo y alrededor de los dispositivos mdicos, y [employer] pliegues de la piel. Pdale a un cuidador que lo haga si no puede hacerlo usted  mismo.  Siga una dieta saludable que incluya protenas, vitaminas y minerales. Pregntele al mdico qu tipo de alimentos debe consumir. Esta informacin no tiene Theme park manager el consejo del mdico. Asegrese de hacerle al mdico cualquier pregunta que tenga. Document Revised: 09/09/2018 Document Reviewed: 09/09/2018 Elsevier Patient Education  2020 [employer].

## 2020-04-04 NOTE — Progress Notes (Signed)
Subjective:     Patient ID: Jeffery [name], male   DOB: [birth year], 19 y.o.   MRN: [id number]  HPI  The patient is here today with his step mother and sisters for follow up of weight. The patient's registered dietitian at [facility] called [place] about one week ago with concerns about the patient missing his appointments recently with Peds GI and Peds Nutrition at their hospital. I called DSS/Adult Protective Services to voice her concern and my own concerns regarding the missed appointments there, as well as his family not notifying us until months later that his diapers were not being delivered to the correct address and he had a severe ulcer about one year ago.  His step mother and younger sister both feel that he has gained weight and they have no concerns about his Gtube feedings. They also states his bowel movement are overall fairly soft.  His step mother states that they are aware and have the dates/times for his rescheduled Peds GI, Peds Nutrition, and an Orthopedics appt. Step mother states that transportation is not a problem.   Step mother also requests a refill of his medications.   Histories reviewed by MD   Review of Systems .Review of Symptoms: General ROS: negative for - fever ENT ROS: negative for - nasal congestion Respiratory ROS: no cough, shortness of breath, or wheezing Gastrointestinal ROS: negative for - change in bowel habits or constipation Dermatological ROS: negative for rash and ulcers     Objective:   Physical Exam Wt 54 lb 12.8 oz (24.9 kg)   BMI 12.74 kg/m   General Appearance:  Alert,  no distress, will occasionally make a moaning type sound; in his wheelchair today   Head:  Atraumatic  Eyes:  PERRL, conjunctiva clear, EOM's intact, eyes will sometimes move in different directions   Ears:  Normal TM's and external ear canals, both ears  Nose: Nares normal, septum midline, mucosa normal, no drainage   Throat: Lips, mucosa, and  tongue normal  Neck: Supple, symmetrical, trachea midline, no adenopathy,   Lungs:   Clear to auscultation bilaterally, respirations unlabored  Heart:  Regular rate and rhythm, S1, S2 normal, no murmur, rub or gallop  Abdomen:   Soft, non-tender, no masses, no organomegaly; G tube in place   Extremities: Extremities normal, atraumatic, no cyanosis or edema  Skin: Skin color, texture       Assessment:     Spastic Quadraplegic  Intellectual Disability  Weight gain  Constipation  Asthma     Plan:     .1. Spastic quadriplegic cerebral palsy (HCC) Keep scheduled appt with Orthopedics for this week   2. Intellectual disability  3. Weight gain Based on our growth chart, he was having weight loss since 2018, since this provider has last seen the patient here, he has a 2 lb weight gain   4. Functional constipation - polyethylene glycol powder (GLYCOLAX/MIRALAX) 17 GM/SCOOP powder; Take 17 grams in 8 ounces of juice or water once to twice a day for up to one week as needed for constipation. Spanish instructions  Dispense: 510 g; Refill: 2  5. Mild intermittent asthma without complication - albuterol (PROAIR HFA) 108 (90 Base) MCG/ACT inhaler; INHALE TWO PUFFS EVERY FOUR HOURS AS NEEDED FOR WHEEZING or COUGHING. Spanish instructions.  Dispense: 8.5 g; Refill: 2  6. Seasonal allergic rhinitis due to pollen - cetirizine HCl (CETIRIZINE HCL CHILDRENS ALRGY) 5 MG/5ML SOLN; 10 mg by per G Tube route nightly as needed  for allergies. Spanish instructions  Dispense: 300 mL; Refill: 5  Step mother has all correct dates for upcoming specialist appointments  MD did speak with DSS/APS last week regarding today's appt with me  MD discussed with step mother to start thinking or looking for an adult medicine physician for her son

## 2020-07-09 ENCOUNTER — Other Ambulatory Visit: Payer: Self-pay | Admitting: Pediatrics

## 2020-07-09 DIAGNOSIS — J4520 Mild intermittent asthma, uncomplicated: Secondary | ICD-10-CM

## 2020-08-29 ENCOUNTER — Telehealth: Payer: Self-pay | Admitting: Licensed Clinical Social Worker

## 2020-08-29 NOTE — Telephone Encounter (Signed)
Received a call from [facility] provider Julio Sicks) inquiring about [employer] services for Patient.  Clinician returned call and confirmed that we do complete an annual service order for the Patient to continue receiving supplies and let her know that we have stressed the need for the last two years for the family to find a family practice to take over the Patient's medical care since he is an adult now.  The Clinician asked the casework to again explain to Mom in order for him to keep ongoing care in place he needs to have an Adult PCP as we cannot provide services beyond 19 years old.

## 2020-10-03 ENCOUNTER — Encounter: Payer: Self-pay | Admitting: Pediatrics

## 2020-12-27 ENCOUNTER — Ambulatory Visit (INDEPENDENT_AMBULATORY_CARE_PROVIDER_SITE_OTHER): Payer: Medicaid Other | Admitting: Pediatrics

## 2020-12-27 ENCOUNTER — Other Ambulatory Visit: Payer: Self-pay

## 2020-12-27 DIAGNOSIS — Z23 Encounter for immunization: Secondary | ICD-10-CM

## 2020-12-27 NOTE — Progress Notes (Signed)
   Covid-19 Vaccination Clinic  Name:  Jeffery [name]    MRN: [id number] DOB: [birth year]  12/27/2020  [name] was observed post Covid-19 immunization for 15 minutes without incident. He was provided with Vaccine Information Sheet and instruction to access the V-Safe system.   [name] was instructed to call 911 with any severe reactions post vaccine: Marland Kitchen Difficulty breathing  . Swelling of face and throat  . A fast heartbeat  . A bad rash all over body  . Dizziness and weakness   Immunizations Administered    Name Date Dose VIS Date Route   Pfizer COVID-19 Vaccine 12/27/2020  1:56 PM 0.3 mL 09/11/2020 Intramuscular   Manufacturer: [employer], [employer]   Lot: [id number]   NDC: [address]-6

## 2021-01-13 ENCOUNTER — Other Ambulatory Visit: Payer: Self-pay | Admitting: Pediatrics

## 2021-01-14 ENCOUNTER — Telehealth: Payer: Self-pay | Admitting: Pediatrics

## 2021-01-14 NOTE — Telephone Encounter (Signed)
Not sure who can help with this, but, I saw that he will be 20 years old soon.  Has his family received a letter in Spanish about him finding a new doctor or can anyone assist or provide them information again?  Thank you

## 2021-01-14 NOTE — Telephone Encounter (Signed)
Im not for sure if anything has been given, ill attach Tanya, I know she works closely with this family.

## 2021-01-14 NOTE — Telephone Encounter (Signed)
I have not done anything regarding this for them.  I did see the Dad here at the front desk not long ago but I am not sure what it was for or what was discussed (I think he was looking for Kenney Houseman).

## 2021-01-17 ENCOUNTER — Ambulatory Visit (INDEPENDENT_AMBULATORY_CARE_PROVIDER_SITE_OTHER): Payer: Medicaid Other | Admitting: Pediatrics

## 2021-01-17 ENCOUNTER — Other Ambulatory Visit: Payer: Self-pay

## 2021-01-17 DIAGNOSIS — Z23 Encounter for immunization: Secondary | ICD-10-CM

## 2021-01-17 NOTE — Telephone Encounter (Signed)
I have not discussed this with them.  Britney, come see me about the letter in Spanish.

## 2021-01-17 NOTE — Telephone Encounter (Signed)
I have reached out to a office in [place] to see if they can take him, I will keep you posted.

## 2021-01-17 NOTE — Progress Notes (Addendum)
   Covid-19 Vaccination Clinic  Name:  Jeffery [name]    MRN: [id number] DOB: [birth year]  01/17/2021  [name] was observed post Covid-19 immunization for 15 minutes without incident. He was provided with Vaccine Information Sheet and instruction to access the V-Safe system.   [name] was instructed to call 911 with any severe reactions post vaccine: Marland Kitchen Difficulty breathing  . Swelling of face and throat  . A fast heartbeat  . A bad rash all over body  . Dizziness and weakness   Immunizations Administered    Name Date Dose VIS Date Route   PFIZER Comrnaty(Gray TOP) Covid-19 Vaccine 01/17/2021  1:17 PM 0.3 mL 10/31/2020 Intramuscular   Manufacturer: [employer], [employer]   Lot: [id number]   NDC: [phone]      I counseled mom in Spanish. There were no concerns today. He is doing well.Marland Kitchen

## 2021-01-22 NOTE — Addendum Note (Signed)
Addended by: Shirlean Kelly T on: 01/22/2021 12:46 PM   Modules accepted: Level of Service

## 2021-01-23 ENCOUNTER — Encounter: Payer: Self-pay | Admitting: Pediatrics

## 2021-02-12 ENCOUNTER — Encounter: Payer: Self-pay | Admitting: Student

## 2021-02-12 ENCOUNTER — Other Ambulatory Visit: Payer: Self-pay

## 2021-02-12 ENCOUNTER — Ambulatory Visit (INDEPENDENT_AMBULATORY_CARE_PROVIDER_SITE_OTHER): Payer: Medicaid Other | Admitting: Student

## 2021-02-12 VITALS — BP 97/47 | HR 95 | Temp 97.8°F

## 2021-02-12 DIAGNOSIS — E559 Vitamin D deficiency, unspecified: Secondary | ICD-10-CM

## 2021-02-12 DIAGNOSIS — G800 Spastic quadriplegic cerebral palsy: Secondary | ICD-10-CM | POA: Diagnosis not present

## 2021-02-12 DIAGNOSIS — Z931 Gastrostomy status: Secondary | ICD-10-CM | POA: Diagnosis not present

## 2021-02-12 DIAGNOSIS — H5440 Blindness, one eye, unspecified eye: Secondary | ICD-10-CM | POA: Diagnosis not present

## 2021-02-12 DIAGNOSIS — L89329 Pressure ulcer of left buttock, unspecified stage: Secondary | ICD-10-CM | POA: Diagnosis not present

## 2021-02-12 DIAGNOSIS — J4520 Mild intermittent asthma, uncomplicated: Secondary | ICD-10-CM | POA: Diagnosis not present

## 2021-02-12 DIAGNOSIS — L89309 Pressure ulcer of unspecified buttock, unspecified stage: Secondary | ICD-10-CM | POA: Insufficient documentation
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
## 2021-02-12 NOTE — Patient Instructions (Addendum)
It is a pleasure seeing you to today in the clinic.  Here is a summary of what we talked about:  1. Gastric tube: Please continue follow-up with pediatric nutrition clinic and gastroenterology.  We will check some blood work today.  2.  Asthma: Please continue taking montelukast and using albuterol inhaler as needed.  3.  Pressure ulcer: The pressure wound on the left side appears in good healing stage.  Please continue offloading and changing his position often.  I will call you for the lab results.  Please come back in 3 months.   Please have all your medications and paperwork with you next visit.  Take care  Dr. Darryl Lent un placer verte hoy en la clnica. Este es un resumen de lo que hablamos:  1. Sonda gstrica: Contine el seguimiento con la clnica de nutricin peditrica y Cytogeneticist. Revisaremos algunos anlisis de [employer].  2. Asma: Contine tomando montelukast y Product/process development scientist de albuterol segn sea necesario.  3. lcera por presin: La herida por presin en el lado izquierdo parece estar en buen estado de cicatrizacin. Contine descargando y [place] su posicin a menudo.  Te llamar para los resultados del laboratorio. Vuelve en 3 meses.  Por favor tenga todos sus medicamentos y documentos con su prxima visita.  Cudate

## 2021-02-12 NOTE — Assessment & Plan Note (Addendum)
Per chart review, patient has a PEG tube placed in 2012.  His stepmom states that he cannot take anything by mouth to risk of aspiration.  They are currently seeing pediatric nutrition clinic and GI, who are monitoring his feeding.  Per nutrition clinic notes, patient has about 20% weight loss since September 2021.   His PEG tube is in the left lower quadrant.  Site appears clean and noninfected with no discharge.  His stepmom is taking care of his feeding and she denies any issue.  She does not measure his weights at home.  Assessment and plan We will monitor his weight closely together with nutrition clinic and GI clinic.  Will consider increase his feeding if continues to lose weight.  He weights 56 pounds in February and 54 pounds today.  Goal nutrition per GI: 76 kcal/kg, 2.7 g/kg protein, and 1730 mL free water  Last CMP was done in September 2021.  -Follow-up with pediatric nutrition clinic -Follow-up with pediatric GI -Monitor his weight -Check CBC/CMP, magnesium, phosphorus and vitamin D  Addendum CMP showed hyponatremia at 147.  Phosphorus elevated at 4.9.  I called and spoke to Reva Bores, the dietitian at pediatric clinical nutrition.  She recommended that we increase his free water to 60cc before feeding and 90 cc after feeding.  She will call family to inform them of the change.  Patient will have his follow-up with them in August.  I have talked to Dr. Tresa Endo for assistant with medication reconciliation.  Plan is to have patient come back next week for an in person visit with Dr. Tresa Endo.  We will also repeat his BMP and phosphorus.  -Repeat BMP and Phos next week

## 2021-02-12 NOTE — Assessment & Plan Note (Signed)
Patient had left buttock pressure ulcer which is healed.  There is some skin discoloration around the area but no signs of active infection.  No open wounds noted.  She currently not following with wound care.  -Advise his stepmom to continue offloading and changing his position frequently

## 2021-02-12 NOTE — Assessment & Plan Note (Signed)
Patient have history of ectopy include allergy, eczema and asthma.  His stepmom states that his asthma is under controlled.  They only use the albuterol inhaler 2 times a week.  She denies any nighttime symptoms, dyspnea.  She endorses intermittent coughing.  -Continue montelukast -Continue albuterol inhaler as needed -Continue cetirizine and Flonase for allergy

## 2021-02-12 NOTE — Assessment & Plan Note (Signed)
DME large pediatric diapers order

## 2021-02-12 NOTE — Assessment & Plan Note (Signed)
His stepmom states the patient has some kind of eye tumor which they are seeing an ophthalmologist.  She is unsure of the detail of the tumor.  Last visit was 6 months ago.   -She will bring his paperwork to next visit

## 2021-02-13 LAB — CMP14 + ANION GAP
ALT: 15 IU/L (ref 0–44)
AST: 26 IU/L (ref 0–40)
Albumin/Globulin Ratio: 2.1 (ref 1.2–2.2)
Albumin: 4.7 g/dL (ref 4.1–5.2)
Alkaline Phosphatase: 102 IU/L (ref 51–125)
Anion Gap: 18 mmol/L (ref 10.0–18.0)
BUN/Creatinine Ratio: 32 — ABNORMAL HIGH (ref 9–20)
BUN: 13 mg/dL (ref 6–20)
Bilirubin Total: 0.2 mg/dL (ref 0.0–1.2)
CO2: 22 mmol/L (ref 20–29)
Calcium: 9.7 mg/dL (ref 8.7–10.2)
Chloride: 107 mmol/L — ABNORMAL HIGH (ref 96–106)
Creatinine, Ser: 0.41 mg/dL — ABNORMAL LOW (ref 0.76–1.27)
Globulin, Total: 2.2 g/dL (ref 1.5–4.5)
Glucose: 82 mg/dL (ref 65–99)
Potassium: 4.6 mmol/L (ref 3.5–5.2)
Sodium: 147 mmol/L — ABNORMAL HIGH (ref 134–144)
Total Protein: 6.9 g/dL (ref 6.0–8.5)
eGFR: 159 mL/min/{1.73_m2} (ref >59)

## 2021-02-13 LAB — MAGNESIUM: Magnesium: 2.3 mg/dL (ref 1.6–2.3)

## 2021-02-13 LAB — CBC
Hematocrit: 38.3 % (ref 37.5–51.0)
Hemoglobin: 12 g/dL — ABNORMAL LOW (ref 13.0–17.7)
MCH: 25.1 pg — ABNORMAL LOW (ref 26.6–33.0)
MCHC: 31.3 g/dL — ABNORMAL LOW (ref 31.5–35.7)
MCV: 80 fL (ref 79–97)
Platelets: 418 10*3/uL (ref 150–450)
RBC: 4.79 x10E6/uL (ref 4.14–5.80)
RDW: 21.6 % — ABNORMAL HIGH (ref 11.6–15.4)
WBC: 6.6 10*3/uL (ref 3.4–10.8)

## 2021-02-13 LAB — PHOSPHORUS: Phosphorus: 4.9 mg/dL — ABNORMAL HIGH (ref 2.8–4.1)

## 2021-02-14 NOTE — Addendum Note (Signed)
Addended byDoran Stabler on: 02/14/2021 10:46 AM   Modules accepted: Orders

## 2021-02-17 NOTE — Progress Notes (Signed)
Internal Medicine Clinic Attending  Case discussed with Dr. Nguyen  At the time of the visit.  We reviewed the resident's history and exam and pertinent patient test results.  I agree with the assessment, diagnosis, and plan of care documented in the resident's note. 

## 2021-02-18 LAB — SPECIMEN STATUS REPORT

## 2021-02-23 LAB — VITAMIN D 25 HYDROXY (VIT D DEFICIENCY, FRACTURES): Vit D, 25-Hydroxy: 30.8 ng/mL (ref 30.0–100.0)

## 2021-02-25 ENCOUNTER — Other Ambulatory Visit: Payer: Medicaid Other

## 2021-02-25 ENCOUNTER — Encounter: Payer: Medicaid Other | Admitting: Pharmacist

## 2021-02-25 NOTE — Telephone Encounter (Signed)
[facility] in [place] has agreed to accept Jeffery [name], they will contact the family about the new patient appointment.

## 2021-02-28 ENCOUNTER — Other Ambulatory Visit: Payer: Self-pay | Admitting: Pediatrics

## 2021-02-28 DIAGNOSIS — K5904 Chronic idiopathic constipation: Secondary | ICD-10-CM

## 2021-04-08 NOTE — Addendum Note (Signed)
Addended byDoran Stabler on: 04/08/2021 10:41 AM   Modules accepted: Orders

## 2021-05-12 ENCOUNTER — Encounter: Payer: Medicaid Other | Admitting: Internal Medicine

## 2021-05-12 ENCOUNTER — Encounter: Payer: Self-pay | Admitting: Student
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
## 2021-05-19 ENCOUNTER — Other Ambulatory Visit: Payer: Self-pay | Admitting: Pediatrics

## 2021-05-19 DIAGNOSIS — K5904 Chronic idiopathic constipation: Secondary | ICD-10-CM

## 2021-05-21 ENCOUNTER — Ambulatory Visit: Payer: Medicaid Other | Admitting: Internal Medicine

## 2021-05-28 ENCOUNTER — Encounter: Payer: Medicaid Other | Admitting: Internal Medicine

## 2021-06-02 ENCOUNTER — Encounter: Payer: Self-pay | Admitting: Pediatrics

## 2021-06-04 ENCOUNTER — Other Ambulatory Visit: Payer: Self-pay | Admitting: Internal Medicine

## 2021-06-04 ENCOUNTER — Ambulatory Visit (INDEPENDENT_AMBULATORY_CARE_PROVIDER_SITE_OTHER): Payer: Medicaid Other | Admitting: Internal Medicine

## 2021-06-04 ENCOUNTER — Other Ambulatory Visit: Payer: Self-pay

## 2021-06-04 ENCOUNTER — Encounter: Payer: Self-pay | Admitting: Internal Medicine

## 2021-06-04 VITALS — BP 91/61 | HR 101 | Temp 98.2°F

## 2021-06-04 DIAGNOSIS — K219 Gastro-esophageal reflux disease without esophagitis: Secondary | ICD-10-CM | POA: Diagnosis not present

## 2021-06-04 DIAGNOSIS — G800 Spastic quadriplegic cerebral palsy: Secondary | ICD-10-CM

## 2021-06-04 DIAGNOSIS — J4530 Mild persistent asthma, uncomplicated: Secondary | ICD-10-CM | POA: Diagnosis not present

## 2021-06-04 DIAGNOSIS — J45909 Unspecified asthma, uncomplicated: Secondary | ICD-10-CM | POA: Diagnosis not present

## 2021-06-04 DIAGNOSIS — J301 Allergic rhinitis due to pollen: Secondary | ICD-10-CM | POA: Diagnosis not present

## 2021-06-04 DIAGNOSIS — E559 Vitamin D deficiency, unspecified: Secondary | ICD-10-CM

## 2021-06-04 DIAGNOSIS — J3089 Other allergic rhinitis: Secondary | ICD-10-CM

## 2021-06-04 DIAGNOSIS — J4520 Mild intermittent asthma, uncomplicated: Secondary | ICD-10-CM | POA: Diagnosis not present

## 2021-06-04 DIAGNOSIS — H5440 Blindness, one eye, unspecified eye: Secondary | ICD-10-CM | POA: Diagnosis not present

## 2021-06-04 DIAGNOSIS — L89329 Pressure ulcer of left buttock, unspecified stage: Secondary | ICD-10-CM | POA: Diagnosis not present

## 2021-06-04 DIAGNOSIS — Z931 Gastrostomy status: Secondary | ICD-10-CM

## 2021-06-04 NOTE — Patient Instructions (Addendum)
Mr.Alberta M Koska, it was a pleasure seeing you today!  Today we discussed: GI Tube-You stated the GI tube is becoming harder to insert. You have an appointment with [facility] and they can evaluate further. Medication Refill- We will provide refills for the patient's chronic medical problems.  Cold Symptoms-It appears Ramiro is has a cold that is about to resolve. Continue current measures and if he worsens bring him back or take him to ED if indicated.   I have ordered the following labs today: Phosphorus and BMP  Lab Orders  No laboratory test(s) ordered today         Referrals ordered today:   Referral Orders  No referral(s) requested today     I have ordered the following medication/changed the following medications:   Stop the following medications: There are no discontinued medications.   Start the following medications: No orders of the defined types were placed in this encounter.    Follow-up: 3 months   Please make sure to arrive 15 minutes prior to your next appointment. If you arrive late, you may be asked to reschedule.   We look forward to seeing you next time. Please call our clinic at [phone] if you have any questions or concerns. The best time to call is Monday-Friday from 9am-4pm, but there is someone available 24/7. If after hours or the weekend, call the main hospital number and ask for the Internal Medicine Resident On-Call. If you need medication refills, please notify your pharmacy one week in advance and they will send [place] a request.  Thank you for letting us take part in your care. Wishing you the best!  Thank you, Gwenevere Abbot, MD

## 2021-06-05 ENCOUNTER — Other Ambulatory Visit: Payer: Self-pay | Admitting: Internal Medicine

## 2021-06-05 LAB — BMP8+ANION GAP
Anion Gap: 17 mmol/L (ref 10.0–18.0)
BUN/Creatinine Ratio: 34 — ABNORMAL HIGH (ref 9–20)
BUN: 13 mg/dL (ref 6–20)
CO2: 26 mmol/L (ref 20–29)
Calcium: 10.1 mg/dL (ref 8.7–10.2)
Chloride: 101 mmol/L (ref 96–106)
Creatinine, Ser: 0.38 mg/dL — ABNORMAL LOW (ref 0.76–1.27)
Glucose: 101 mg/dL — ABNORMAL HIGH (ref 65–99)
Potassium: 4.8 mmol/L (ref 3.5–5.2)
Sodium: 144 mmol/L (ref 134–144)
eGFR: 163 mL/min/{1.73_m2} (ref >59)

## 2021-06-05 LAB — PHOSPHORUS: Phosphorus: 3.6 mg/dL (ref 2.8–4.1)

## 2021-06-05 MED ORDER — MONTELUKAST SODIUM 5 MG PO CHEW: CHEWABLE_TABLET | ORAL | 0 refills | Status: AC

## 2021-06-05 MED ORDER — BECLOMETHASONE DIPROP HFA 40 MCG/ACT IN AERB: 2.0000 | INHALATION_SPRAY | Freq: Two times a day (BID) | RESPIRATORY_TRACT | 0 refills | Status: DC

## 2021-06-05 MED ORDER — FLUTICASONE PROPIONATE 50 MCG/ACT NA SUSP: NASAL | 5 refills | Status: AC

## 2021-06-05 MED ORDER — ZINC OXIDE 40 % EX OINT: 1.0000 "application " | TOPICAL_OINTMENT | CUTANEOUS | 1 refills | Status: AC | PRN

## 2021-06-05 MED ORDER — ZINC OXIDE 40 % EX OINT: 1.0000 "application " | TOPICAL_OINTMENT | CUTANEOUS | 1 refills | Status: DC | PRN

## 2021-06-05 MED ORDER — ERGOCALCIFEROL 200 MCG/ML PO SOLN: 4000.0000 [IU] | Freq: Every day | ORAL | Status: DC

## 2021-06-05 MED ORDER — CETIRIZINE HCL 5 MG/5ML PO SOLN: ORAL | 5 refills | Status: AC

## 2021-06-05 MED ORDER — ALBUTEROL SULFATE HFA 108 (90 BASE) MCG/ACT IN AERS
INHALATION_SPRAY | RESPIRATORY_TRACT | 0 refills | Status: AC
Start: 2021-06-05 — End: ?

## 2021-06-05 MED ORDER — BACLOFEN 10 MG PO TABS: 20.0000 mg | ORAL_TABLET | Freq: Three times a day (TID) | ORAL | 2 refills | Status: AC

## 2021-06-05 MED ORDER — GLYCOPYRROLATE 1 MG PO TABS: 1.0000 mg | ORAL_TABLET | Freq: Two times a day (BID) | ORAL | 0 refills | Status: AC

## 2021-06-05 MED ORDER — ACIDOPHILUS PO CAPS: 1.0000 | ORAL_CAPSULE | Freq: Every day | ORAL | 0 refills | Status: AC

## 2021-06-05 MED ORDER — GABAPENTIN 250 MG/5ML PO SOLN: ORAL | 0 refills | Status: AC

## 2021-06-05 MED ORDER — OMEPRAZOLE 20 MG PO CPDR: 20.0000 mg | DELAYED_RELEASE_CAPSULE | Freq: Every day | ORAL | 12 refills | Status: AC

## 2021-06-10 ENCOUNTER — Encounter: Payer: Self-pay | Admitting: Internal Medicine

## 2021-06-10 NOTE — Assessment & Plan Note (Signed)
Assessment Patient has spastic quadriplegic cerebral palsy that is at baseline.  Continue  -Continue current medications

## 2021-06-10 NOTE — Assessment & Plan Note (Signed)
Assessment: Patient had a history of pressure ulcer that is resolved. Continue to follow up to see if they develop in the future and educated patient on ulcer prevention.   Plan: -Continue to follow up -Pressure ulcer precautions
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
## 2021-06-10 NOTE — Assessment & Plan Note (Signed)
Assessment: Patient is G tube dependant for his nutrition and medicines. Mom reported difficulty replacing G tube as it is getting harder to insert. Will continue to monitor and advised patient to share this with patient's Pediatric GI at the next appointment.  Plan: -Continue to monitor -Follow up with [facility] -Refilling all of patients medications here as mother faces difficulty getting chronic medications from multiple sources.

## 2021-06-10 NOTE — Progress Notes (Signed)
Internal Medicine Clinic Attending  I saw and evaluated the patient.  I personally confirmed the key portions of the history and exam documented by Dr.  Kahn  and I reviewed pertinent patient test results.  The assessment, diagnosis, and plan were formulated together and I agree with the documentation in the resident's note.  

## 2021-06-10 NOTE — Assessment & Plan Note (Signed)
Assessment: Patient has asthma that is well controlled. Continue current medications.  Plan: -Continue montelukast  -Continue albuterol prn -Continue cetrizine and flonase for allergy

## 2021-06-10 NOTE — Assessment & Plan Note (Signed)
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 We requested records from the mother but she forgot to bring it to her appointment. Patient reported eye condition as stable.   Plan: -Continue to follow with ophthalmology  -Requested patient to bring records to next appointment

## 2021-06-11 ENCOUNTER — Other Ambulatory Visit: Payer: Self-pay | Admitting: Student

## 2021-06-11 ENCOUNTER — Telehealth: Payer: Self-pay | Admitting: *Deleted

## 2021-06-11 DIAGNOSIS — J45909 Unspecified asthma, uncomplicated: Secondary | ICD-10-CM

## 2021-06-11 MED ORDER — FLUTICASONE PROPIONATE HFA 44 MCG/ACT IN AERO: 1.0000 | INHALATION_SPRAY | Freq: Two times a day (BID) | RESPIRATORY_TRACT | 2 refills | Status: AC

## 2021-06-11 NOTE — Progress Notes (Signed)
Qvar not covered by patient's insurance, will switch to Flovent.

## 2021-06-11 NOTE — Telephone Encounter (Signed)
Attempts to do PA fo Qvar.   No longer on Medicaid formulary Advair Diskus, Advar HFA, Dulera, Flovent Diskus, Spiriva Respimat and Arnuity are available without a PA .  Angelina Ok, RN 06/11/2021  11:51  AM

## 2021-08-20 ENCOUNTER — Ambulatory Visit: Payer: Medicaid Other

## 2021-08-20 ENCOUNTER — Other Ambulatory Visit: Payer: Self-pay

## 2022-02-19 ENCOUNTER — Telehealth: Payer: Self-pay

## 2022-02-19 NOTE — Telephone Encounter (Addendum)
Call from patient' mom through interpreter need to get refill on his Boost.  Needs also to get order for additional Diapers and Tube Button for G Tube in stomach.  Would like supplies and Boost sent to address in [place].   Number in [place] is [phone] ?Call to patient's GI doctor in [facility] to see what would need to be ordered and the size.Marland Kitchen   ?

## 2022-03-03 NOTE — Telephone Encounter (Signed)
Sounds good. Let me know if there is anything for me to do to help her.

## 2023-04-19 ENCOUNTER — Encounter: Payer: Self-pay | Admitting: *Deleted
